# Patient Record
Sex: Male | Born: 1990 | Race: White | Hispanic: No | Marital: Single | Smoking: Former smoker
Health system: Southern US, Community
[De-identification: ages and names within clinical notes are randomized; demographics above are authoritative.]

## PROBLEM LIST (undated history)

## (undated) DIAGNOSIS — K219 Gastro-esophageal reflux disease without esophagitis: Secondary | ICD-10-CM

## (undated) DIAGNOSIS — J45909 Unspecified asthma, uncomplicated: Secondary | ICD-10-CM

## (undated) DIAGNOSIS — F259 Schizoaffective disorder, unspecified: Secondary | ICD-10-CM

## (undated) DIAGNOSIS — F4310 Post-traumatic stress disorder, unspecified: Secondary | ICD-10-CM

## (undated) DIAGNOSIS — K649 Unspecified hemorrhoids: Secondary | ICD-10-CM

## (undated) DIAGNOSIS — K589 Irritable bowel syndrome without diarrhea: Secondary | ICD-10-CM

## (undated) DIAGNOSIS — R569 Unspecified convulsions: Secondary | ICD-10-CM

## (undated) DIAGNOSIS — M1990 Unspecified osteoarthritis, unspecified site: Secondary | ICD-10-CM

## (undated) DIAGNOSIS — F419 Anxiety disorder, unspecified: Secondary | ICD-10-CM

## (undated) HISTORY — PX: OTHER SURGICAL HISTORY: SHX169

## (undated) HISTORY — PX: MOUTH SURGERY: SHX715

---
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
## 2012-05-14 ENCOUNTER — Encounter (HOSPITAL_COMMUNITY): Payer: Self-pay | Admitting: *Deleted

## 2012-05-14 ENCOUNTER — Emergency Department (HOSPITAL_COMMUNITY)
Admission: EM | Admit: 2012-05-14 | Discharge: 2012-05-19 | Disposition: A | Discharge status: Home / Self Care | Payer: MEDICAID | Attending: Emergency Medicine | Admitting: Emergency Medicine

## 2012-05-14 DIAGNOSIS — J45909 Unspecified asthma, uncomplicated: Secondary | ICD-10-CM | POA: Insufficient documentation

## 2012-05-14 DIAGNOSIS — K589 Irritable bowel syndrome without diarrhea: Secondary | ICD-10-CM | POA: Insufficient documentation

## 2012-05-14 DIAGNOSIS — F29 Unspecified psychosis not due to a substance or known physiological condition: Secondary | ICD-10-CM | POA: Insufficient documentation

## 2012-05-14 DIAGNOSIS — J701 Chronic and other pulmonary manifestations due to radiation: Secondary | ICD-10-CM | POA: Insufficient documentation

## 2012-05-14 DIAGNOSIS — F4310 Post-traumatic stress disorder, unspecified: Secondary | ICD-10-CM | POA: Insufficient documentation

## 2012-05-14 DIAGNOSIS — F411 Generalized anxiety disorder: Secondary | ICD-10-CM | POA: Insufficient documentation

## 2012-05-14 DIAGNOSIS — F259 Schizoaffective disorder, unspecified: Secondary | ICD-10-CM | POA: Insufficient documentation

## 2012-05-14 DIAGNOSIS — F410 Panic disorder [episodic paroxysmal anxiety] without agoraphobia: Secondary | ICD-10-CM | POA: Insufficient documentation

## 2012-05-14 HISTORY — DX: Post-traumatic stress disorder, unspecified: F43.10

## 2012-05-14 HISTORY — DX: Anxiety disorder, unspecified: F41.9

## 2012-05-14 HISTORY — DX: Gastro-esophageal reflux disease without esophagitis: K21.9

## 2012-05-14 HISTORY — DX: Unspecified asthma, uncomplicated: J45.909

## 2012-05-14 HISTORY — DX: Schizoaffective disorder, unspecified: F25.9

## 2012-05-14 HISTORY — DX: Unspecified osteoarthritis, unspecified site: M19.90

## 2012-05-14 HISTORY — DX: Irritable bowel syndrome, unspecified: K58.9

## 2012-05-14 LAB — CBC
MCV: 89.9 fL (ref 78.0–100.0)
Platelets: 305 10*3/uL (ref 150–400)
RBC: 4.93 MIL/uL (ref 4.22–5.81)
RDW: 12.5 % (ref 11.5–15.5)
WBC: 12.5 10*3/uL — ABNORMAL HIGH (ref 4.0–10.5)

## 2012-05-14 LAB — COMPREHENSIVE METABOLIC PANEL
ALT: 38 U/L (ref 0–53)
AST: 27 U/L (ref 0–37)
Albumin: 3.9 g/dL (ref 3.5–5.2)
Alkaline Phosphatase: 92 U/L (ref 39–117)
CO2: 25 mEq/L (ref 19–32)
Chloride: 100 mEq/L (ref 96–112)
Creatinine, Ser: 0.74 mg/dL (ref 0.50–1.35)
GFR calc non Af Amer: >90 mL/min (ref >90)
Potassium: 3.4 mEq/L — ABNORMAL LOW (ref 3.5–5.1)
Sodium: 137 mEq/L (ref 135–145)
Total Bilirubin: 0.4 mg/dL (ref 0.3–1.2)

## 2012-05-14 LAB — ETHANOL: Alcohol, Ethyl (B): <11 mg/dL (ref 0–11)

## 2012-05-14 MED ORDER — IBUPROFEN 200 MG PO TABS
600.0000 mg | ORAL_TABLET | Freq: Three times a day (TID) | ORAL | Status: DC | PRN
  Administered 2012-05-15: 600 mg via ORAL
  Filled 2012-05-14: qty 3

## 2012-05-14 MED ORDER — ONDANSETRON HCL 4 MG PO TABS: 4.0000 mg | ORAL_TABLET | Freq: Three times a day (TID) | ORAL | Status: DC | PRN

## 2012-05-14 MED ORDER — LORAZEPAM 1 MG PO TABS
1.0000 mg | ORAL_TABLET | Freq: Three times a day (TID) | ORAL | Status: DC | PRN
  Administered 2012-05-15 – 2012-05-17 (×4): 1 mg via ORAL
  Filled 2012-05-14 (×4): qty 1

## 2012-05-14 MED ORDER — ZOLPIDEM TARTRATE 5 MG PO TABS
5.0000 mg | ORAL_TABLET | Freq: Every evening | ORAL | Status: DC | PRN
  Administered 2012-05-15 – 2012-05-18 (×4): 5 mg via ORAL
  Filled 2012-05-14 (×4): qty 1

## 2012-05-14 MED ORDER — ALUM & MAG HYDROXIDE-SIMETH 200-200-20 MG/5ML PO SUSP: 30.0000 mL | ORAL | Status: DC | PRN

## 2012-05-14 NOTE — ED Provider Notes (Signed)
Medical screening examination/treatment/procedure(s) were performed by non-physician practitioner and as supervising physician I was immediately available for consultation/collaboration.   Charles B. Bernette Mayers, MD 05/14/12 [phone]

## 2012-05-14 NOTE — ED Provider Notes (Addendum)
History     CSN: [id number]  Arrival date & time 05/14/12  2157   First MD Initiated Contact with Patient 05/14/12 2239      Chief Complaint  Patient presents with  . Medical Clearance    (Consider location/radiation/quality/duration/timing/severity/associated sxs/prior treatment) HPI Comments: 21 year old male presents emergency Department with a panic attack. States he has had increased anxiety all day today. States he is living in town and he does not want to live in. His thought process is very tangential and I am unable to obtain a good history. He is a level V caveat. He states he is on many medications, but believes he is not on enough. He states no one cares about his care. He has a therapist with [facility]. Denies any suicidal or homicidal ideations. He keeps mentioning his sister who lives in [address], but when asked about her he states "no one ever listens to me". Denies any alcohol or drug use within the past week. Admits to marijuana use last week.  The history is provided by the patient. The history is limited by the condition of the patient.    Past Medical History  Diagnosis Date  . Schizoaffective disorder   . GERD (gastroesophageal reflux disease)   . Arthritis   . IBS (irritable bowel syndrome)   . Anxiety   . Asthma   . PTSD (post-traumatic stress disorder)     History reviewed. No pertinent past surgical history.  No family history on file.  History  Substance Use Topics  . Smoking status: Current Everyday Smoker -- 2.0 packs/day  . Smokeless tobacco: Not on file  . Alcohol Use: No      Review of Systems  Unable to perform ROS Constitutional:       When asking ROS, patient rolls his eyes and states "no one ever listens the first time"    Allergies  Divalproex sodium  Home Medications   Current Outpatient Rx  Name Route Sig Dispense Refill  . ALPRAZOLAM 0.25 MG PO TABS Oral Take 0.25 mg by mouth 2 (two) times daily.    Marland Kitchen  CARBAMAZEPINE 200 MG PO TABS Oral Take 200 mg by mouth 2 (two) times daily.      BP 142/83  Pulse 88  Temp 99 F (37.2 C) (Oral)  Resp 20  SpO2 96%  Physical Exam  Nursing note and vitals reviewed. Constitutional:       Unable to perform PE due to patient leaving during interview  Psychiatric: His mood appears anxious. His affect is angry. His speech is delayed and tangential. He is agitated. He expresses no homicidal and no suicidal ideation. He expresses no suicidal plans and no homicidal plans. He is inattentive.    ED Course  Procedures (including critical care time)   Labs Reviewed  CBC  COMPREHENSIVE METABOLIC PANEL  URINE RAPID DRUG SCREEN (HOSP PERFORMED)  ETHANOL   No results found.   1. Psychosis   2. Anxiety attack       MDM  21 year old male with anxiety attack. He denies any suicidal or homicidal ideations. He got angry during the interview and stated that "no one ever listens to what I said the first time" and then went on to say "I'd rather be in jail where some listens to me and I never should came to the emergency room". He then got up and ran out of the emergency department. Security was unable to get him. Patient does not appear to be  a harm to himself or others. I do not feel he needs involuntary commitment. I feel as if he is safe outside of the emergency department.     12:18 AM Patient returned to the emergency department and states he would like to stay. He was moved straight back to the psych ED.   Trevor Mace, PA-C 05/14/12 2327  Trevor Mace, PA-C 05/14/12 2333  Trevor Mace, PA-C 05/15/12 [phone]

## 2012-05-14 NOTE — ED Notes (Signed)
Pt tearful; crying; states has been feeling bad because he has been away from his family; states does not feel safe where he is living; states has therapy tomorrow

## 2012-05-15 LAB — URINALYSIS, ROUTINE W REFLEX MICROSCOPIC
Hgb urine dipstick: NEGATIVE
Nitrite: NEGATIVE
Protein, ur: NEGATIVE mg/dL
Specific Gravity, Urine: 1.03 (ref 1.005–1.030)
Urobilinogen, UA: 1 mg/dL (ref 0.0–1.0)

## 2012-05-15 LAB — RAPID URINE DRUG SCREEN, HOSP PERFORMED
Barbiturates: NOT DETECTED
Tetrahydrocannabinol: NOT DETECTED

## 2012-05-15 MED ORDER — LORAZEPAM 1 MG PO TABS
1.0000 mg | ORAL_TABLET | Freq: Two times a day (BID) | ORAL | Status: DC
  Administered 2012-05-16 – 2012-05-18 (×6): 1 mg via ORAL
  Filled 2012-05-15 (×7): qty 1

## 2012-05-15 MED ORDER — RISPERIDONE 1 MG PO TABS
1.0000 mg | ORAL_TABLET | Freq: Two times a day (BID) | ORAL | Status: DC
  Administered 2012-05-16 – 2012-05-18 (×6): 1 mg via ORAL
  Filled 2012-05-15 (×7): qty 1

## 2012-05-15 MED ORDER — CARBAMAZEPINE 200 MG PO TABS
200.0000 mg | ORAL_TABLET | Freq: Two times a day (BID) | ORAL | Status: DC
  Administered 2012-05-16 – 2012-05-19 (×7): 200 mg via ORAL
  Filled 2012-05-15 (×9): qty 1

## 2012-05-15 MED ORDER — ZIPRASIDONE MESYLATE 20 MG IM SOLR
20.0000 mg | Freq: Once | INTRAMUSCULAR | Status: AC
  Administered 2012-05-15: 20 mg via INTRAMUSCULAR
  Filled 2012-05-15: qty 20
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
## 2012-05-15 NOTE — ED Provider Notes (Signed)
Medical screening examination/treatment/procedure(s) were performed by non-physician practitioner and as supervising physician I was immediately available for consultation/collaboration.  Jasmine Awe, MD 05/15/12 [phone]
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
## 2012-05-15 NOTE — ED Notes (Signed)
Pt changed to blue scrubs; wanded by security 

## 2012-05-15 NOTE — ED Provider Notes (Signed)
Psychiatry recommends admission.  Glynn Octave, MD 05/15/12 [phone]
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
## 2012-05-15 NOTE — BH Assessment (Signed)
BHH Assessment Progress Note      Consulted with EDP Dr. Manus Gunning and recommended Tele-Psych consult on pt. Dr. Manus Gunning agreed to initiate tele-psych consult.

## 2012-05-15 NOTE — ED Notes (Signed)
Pt brought over from ED and taken to room 31 and made comfortable.

## 2012-05-15 NOTE — ED Notes (Signed)
Pt out at the desk made a phone call got through to his sister pt upset on the phone yelling out "I want to [facility] everyone" pt escorted back to his room by NT and this recorder pt then took his tray off the floor and gave it to this recorder pt was cursing and yelling at NT and RN the door to pt's room was closed pt observed picking up his tray and threw it at the door then went to the TV.pt punched tv broke screen. NT and Rn to the desk to call for assist and call EDP to receive new orders pt yelling at staff hitting the window at nurses desk and pulling at the window at nurses desk pt aggressive in stance towards other pts as well

## 2012-05-15 NOTE — ED Provider Notes (Signed)
Pt came last night for anxiety and possible psychosis. States he is feeling all right this morning. Is waiting for ACT evaluation.   Devoria Albe, MD, FACEP   Ward Givens, MD 05/15/12 0830

## 2012-05-15 NOTE — ED Provider Notes (Signed)
Nurses called about 11:49 and said patient wanted to be discharaged, has not had ACT evaluation yet. Advised he needed to wait.  Called back at 12:14 by nursing staff, patient having major emotional blow up, has torn up his paper scrubs and is screaming and threatening staff. Geodon ordered.   Devoria Albe, MD, Armando Gang   Ward Givens, MD 05/15/12 [phone]

## 2012-05-16 ENCOUNTER — Encounter (HOSPITAL_COMMUNITY): Payer: Self-pay | Admitting: *Deleted

## 2012-05-16 LAB — CARBAMAZEPINE LEVEL, TOTAL: Carbamazepine Lvl: 3.5 ug/mL — ABNORMAL LOW (ref 4.0–12.0)

## 2012-05-16 MED ORDER — LORAZEPAM 2 MG/ML IJ SOLN
2.0000 mg | INTRAMUSCULAR | Status: DC | PRN
  Administered 2012-05-16 – 2012-05-18 (×2): 2 mg via INTRAMUSCULAR
  Filled 2012-05-16 (×2): qty 1

## 2012-05-16 MED ORDER — NICOTINE 21 MG/24HR TD PT24
21.0000 mg | MEDICATED_PATCH | Freq: Every day | TRANSDERMAL | Status: DC
  Administered 2012-05-16 – 2012-05-18 (×3): 21 mg via TRANSDERMAL
  Filled 2012-05-16 (×4): qty 1

## 2012-05-16 MED ORDER — HALOPERIDOL LACTATE 5 MG/ML IJ SOLN
5.0000 mg | Freq: Four times a day (QID) | INTRAMUSCULAR | Status: DC | PRN
  Administered 2012-05-16 – 2012-05-18 (×4): 5 mg via INTRAMUSCULAR
  Filled 2012-05-16 (×4): qty 1
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
## 2012-05-16 NOTE — BHH Counselor (Signed)
Jane at [facility] - requesting Tegretol level. Also repeat CBC and chem tomorrow. Pt not on wait list yet until after labs are adequate.

## 2012-05-16 NOTE — BHH Counselor (Signed)
Pt has been referred to [facility] waiting for RN to call back when pt is officially on wait list. Authorization number is [id number].

## 2012-05-16 NOTE — ED Notes (Signed)
Info refaxed to [facility].

## 2012-05-16 NOTE — BHH Counselor (Signed)
Spoke with [facility] and they stated information on pt was not faxed over but a phone consult had been done. Previous counselor note states info was faxed and there was paperwork supporting previous counselor's note. Tammy Sours, SW, re-faxed [facility] form and pt's paperwork to [facility].

## 2012-05-16 NOTE — BH Assessment (Signed)
Assessment Note   John Gibson is an 21 y.o. male who initially presented to [facility] Emergency Department due to the chief complaint of increased anxiety. Per previous assessment, pt exhibited frustration in regards to his current living situation and was uncooperative with answering questions posed by assessor. During today's reassessment patient verbalized to writer that he desires to leave the emergency department, stating the he believes he is stable at this point. Per patient, yesterday he became agitated with staff and became destructive, evidenced by patient punching the television in his current room with it later having to be removed from the room. Patient verbalized to writer that when he becomes angry it is difficult for him manage his emotions. Patient reported that he currently does not have the desire to harm anyone or himself today although Clinical research associate observed patient to become verbally aggressive with staff when he was informed that he could not smoke cigarettes within the hospital. During reassessment patient was observed to exhibit irritability and anxiety in regard to his stay in the emergency department. Patient denies SI/HI/AVH at this time and is pending [facility] waitlist.   Axis I: Mood Disorder NOS Axis II: No diagnosis Axis III:  Past Medical History  Diagnosis Date  . Schizoaffective disorder   . GERD (gastroesophageal reflux disease)   . Arthritis   . IBS (irritable bowel syndrome)   . Anxiety   . Asthma   . PTSD (post-traumatic stress disorder)    Axis IV: housing problems, other psychosocial or environmental problems and problems related to social environment Axis V: 41-50 serious symptoms  Past Medical History:  Past Medical History  Diagnosis Date  . Schizoaffective disorder   . GERD (gastroesophageal reflux disease)   . Arthritis   . IBS (irritable bowel syndrome)   . Anxiety   . Asthma   . PTSD (post-traumatic stress disorder)     History reviewed. No  pertinent past surgical history.  Family History: No family history on file.  Social History:  reports that he has been smoking.  He does not have any smokeless tobacco history on file. He reports that he uses illicit drugs (Marijuana). He reports that he does not drink alcohol.  Additional Social History:  Alcohol / Drug Use Pain Medications: See MAR Prescriptions: See MAR Over the Counter: See MAR History of alcohol / drug use?: No history of alcohol / drug abuse (Not currently per pt)  CIWA: CIWA-Ar BP: 113/74 mmHg Pulse Rate: 79  COWS:    Allergies:  Allergies  Allergen Reactions  . Divalproex Sodium Hives    Home Medications:  (Not in a hospital admission)  OB/GYN Status:  No LMP for male patient.  General Assessment Data [facility] of Assessment: [facility] ACT Assessment: Yes Living Arrangements: Other (Comment) (Boarding house) Can pt return to current living arrangement?: Yes Admission Status: Involuntary Is patient capable of signing voluntary admission?: No Transfer from: Unknown Referral Source: MD     Risk to self Suicidal Ideation: No Suicidal Intent: No Is patient at risk for suicide?: No Suicidal Plan?: No Access to Means: No What has been your use of drugs/alcohol within the last 12 months?: THC noted Previous Attempts/Gestures: No Other Self Harm Risks: None Reported Triggers for Past Attempts: None known Intentional Self Injurious Behavior: None Family Suicide History: Unknown Recent stressful life event(s): Other (Comment) (Housing stressors, Limited support) Persecutory voices/beliefs?: No Depression: No Depression Symptoms: Feeling angry/irritable;Loss of interest in usual pleasures Substance abuse history and/or treatment for substance abuse?: Yes  Suicide prevention information given to non-admitted patients: Not applicable  Risk to Others Homicidal Ideation: No-Not Currently/Within Last 6 Months Thoughts of Harm to Others: No-Not Currently  Present/Within Last 6 Months Comment - Thoughts of Harm to Others: Was aggressive towards staff yesterday however no HI currently Current Homicidal Intent: No Current Homicidal Plan: No Access to Homicidal Means: No Identified Victim: None Reported History of harm to others?: No Assessment of Violence: None Noted Violent Behavior Description: Currently irritable and upset Does patient have access to weapons?: No Criminal Charges Pending?: No Does patient have a court date: No  Psychosis Hallucinations: None noted Delusions: None noted  Mental Status Report Appear/Hygiene: Disheveled Eye Contact: Fair Motor Activity: Restlessness Speech: Logical/coherent;Aggressive Level of Consciousness: Alert Mood: Depressed;Anxious Affect: Anxious;Apprehensive Anxiety Level: Moderate Thought Processes: Relevant;Coherent Judgement: Impaired Orientation: Person;Place;Time Obsessive Compulsive Thoughts/Behaviors: None  Cognitive Functioning Concentration: Decreased Memory: Recent Intact;Remote Intact IQ: Average Insight: Poor Impulse Control: Poor Appetite: Good Weight Loss: 0  Weight Gain: 0  Sleep: Decreased Total Hours of Sleep:  (Unknown) Vegetative Symptoms: None  ADLScreening [facility]) Patient's cognitive ability adequate to safely complete daily activities?: Yes Patient able to express need for assistance with ADLs?: Yes Independently performs ADLs?: Yes (appropriate for developmental age)  Abuse/Neglect [facility]) Physical Abuse:  (Pt would not say) Verbal Abuse:  (Pt would not say) Sexual Abuse:  (Pt would not say)  Prior Inpatient Therapy Prior Inpatient Therapy: Yes Prior Therapy Dates: current Prior Therapy Facilty[facility]Provider[facility] Reason for Treatment: Unknown  Prior Outpatient Therapy Prior Outpatient Therapy: No Prior Therapy Dates: ukn Prior Therapy Facilty/Provider(s): [facility] Reason for Treatment: ukn  ADL Screening (condition at  time of admission) Patient's cognitive ability adequate to safely complete daily activities?: Yes Patient able to express need for assistance with ADLs?: Yes Independently performs ADLs?: Yes (appropriate for developmental age) Weakness of Legs: None Weakness of Arms/Hands: None  Home Assistive Devices/Equipment Home Assistive Devices/Equipment: None  Therapy Consults (therapy consults require a physician order) PT Evaluation Needed: No OT Evalulation Needed: No SLP Evaluation Needed: No Abuse/Neglect Assessment (Assessment to be complete while patient is alone) Physical Abuse:  (Pt would not say) Verbal Abuse:  (Pt would not say) Sexual Abuse:  (Pt would not say) Exploitation of patient/patient's resources:  (Pt would not say) Self-Neglect: Denies Values / Beliefs Cultural Requests During Hospitalization: None Spiritual Requests During Hospitalization: None Consults Spiritual Care Consult Needed: No Social Work Consult Needed: No Merchant navy officer (For Healthcare) Advance Directive: Patient does not have advance directive;Patient would not like information Nutrition Screen- [facility] Adult/[facility] Have you recently lost weight without trying?: No Have you been eating poorly because of a decreased appetite?: No Malnutrition Screening Tool Score: 0   Additional Information 1:1 In Past 12 Months?: No CIRT Risk: Yes Elopement Risk: No Does patient have medical clearance?: Yes     Disposition: Pending [facility] wait list for inpatient treatment.  Disposition Disposition of Patient: Inpatient treatment program Type of inpatient treatment program: Adult Other disposition(s): Other (Comment) (Pending Telepsych)  On Site Evaluation by:   Reviewed with Physician:     Paulino Door, Lorah Kalina C 05/16/2012 2:03 PM

## 2012-05-16 NOTE — ED Provider Notes (Signed)
Patient resting comfortably. He broke the TV yesterday and was aggressive towards staff and was given Geodon. He is ordered for prn ativan now. As per psych, will need to be inpatient. Pending placement.   Richardean Canal, MD 05/16/12 [phone]

## 2012-05-17 LAB — CBC WITH DIFFERENTIAL/PLATELET
Basophils Absolute: 0.1 10*3/uL (ref 0.0–0.1)
Basophils Relative: 1 % (ref 0–1)
Eosinophils Relative: 5 % (ref 0–5)
HCT: 40.3 % (ref 39.0–52.0)
MCHC: 34.2 g/dL (ref 30.0–36.0)
MCV: 89.6 fL (ref 78.0–100.0)
Monocytes Absolute: 0.7 10*3/uL (ref 0.1–1.0)
Neutro Abs: 7.4 10*3/uL (ref 1.7–7.7)
Platelets: 243 10*3/uL (ref 150–400)
RDW: 12.4 % (ref 11.5–15.5)
WBC: 11.7 10*3/uL — ABNORMAL HIGH (ref 4.0–10.5)

## 2012-05-17 LAB — BASIC METABOLIC PANEL
Calcium: 9.2 mg/dL (ref 8.4–10.5)
Creatinine, Ser: 0.66 mg/dL (ref 0.50–1.35)
GFR calc Af Amer: >90 mL/min (ref >90)
Sodium: 137 mEq/L (ref 135–145)
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
## 2012-05-17 NOTE — ED Provider Notes (Addendum)
Patient sleeping comfortably. He is less agitated after starting on antipsychotics. Psych requested repeat CBC, BMP, will do that this AM. Pending inpatient psych placement.   Richardean Canal, MD 05/17/12 [phone]  CBC showed WBC 11, trending down. BMP unremarkable.   Richardean Canal, MD 05/17/12 [phone]

## 2012-05-17 NOTE — ED Notes (Signed)
DR. Dierdre Highman made aware of tegretol level 3.5. Pt currently taking tegretol. No new orders

## 2012-05-18 ENCOUNTER — Encounter (HOSPITAL_COMMUNITY): Payer: Self-pay

## 2012-05-18 DIAGNOSIS — F411 Generalized anxiety disorder: Secondary | ICD-10-CM

## 2012-05-18 DIAGNOSIS — F259 Schizoaffective disorder, unspecified: Secondary | ICD-10-CM

## 2012-05-18 DIAGNOSIS — F70 Mild intellectual disabilities: Secondary | ICD-10-CM

## 2012-05-18 LAB — CBC WITH DIFFERENTIAL/PLATELET
Basophils Absolute: 0.1 10*3/uL (ref 0.0–0.1)
Lymphocytes Relative: 11 % — ABNORMAL LOW (ref 12–46)
Lymphs Abs: 1.3 10*3/uL (ref 0.7–4.0)
Neutro Abs: 8.8 10*3/uL — ABNORMAL HIGH (ref 1.7–7.7)
Neutrophils Relative %: 80 % — ABNORMAL HIGH (ref 43–77)
Platelets: 262 10*3/uL (ref 150–400)
RBC: 4.83 MIL/uL (ref 4.22–5.81)
RDW: 12.3 % (ref 11.5–15.5)
WBC: 11 10*3/uL — ABNORMAL HIGH (ref 4.0–10.5)

## 2012-05-18 LAB — BASIC METABOLIC PANEL
CO2: 25 mEq/L (ref 19–32)
Calcium: 9.6 mg/dL (ref 8.4–10.5)
Chloride: 99 mEq/L (ref 96–112)
Potassium: 4.1 mEq/L (ref 3.5–5.1)
Sodium: 136 mEq/L (ref 135–145)

## 2012-05-18 LAB — CARBAMAZEPINE LEVEL, TOTAL: Carbamazepine Lvl: 8.4 ug/mL (ref 4.0–12.0)

## 2012-05-18 MED ORDER — LORAZEPAM 1 MG PO TABS: 1.0000 mg | ORAL_TABLET | Freq: Three times a day (TID) | ORAL | Status: DC | PRN

## 2012-05-18 MED ORDER — CARBAMAZEPINE 200 MG PO TABS: 200.0000 mg | ORAL_TABLET | Freq: Two times a day (BID) | ORAL | Status: DC

## 2012-05-18 MED ORDER — ALPRAZOLAM 0.25 MG PO TABS
0.2500 mg | ORAL_TABLET | Freq: Two times a day (BID) | ORAL | Status: DC
  Administered 2012-05-18 – 2012-05-19 (×2): 0.25 mg via ORAL
  Filled 2012-05-18 (×2): qty 1

## 2012-05-18 NOTE — BHH Counselor (Signed)
Writer has attempted to call patient's mother 2 more times and left voicemail's asking if she could pick patient up from the ED.

## 2012-05-18 NOTE — Progress Notes (Signed)
Per Jessie Foot with the ACT team, Pt ready for d/c, however his mother is unreachable.  CSW LM for Pt's mom.  CSW LM for Pt's payee, Adis Sturgill, at [phone].  Providence Crosby, LCSWA Clinical Social Work [phone]

## 2012-05-18 NOTE — ED Provider Notes (Signed)
Assuming care of [name] this morning.  Patient is awaiting psych evaluation - likely inpatient placement. Medically cleared. Patient had no complains, no concerns from the nursing side. Will continue to monitor.  John Kaplan, MD 05/18/12 [phone]

## 2012-05-18 NOTE — BHH Counselor (Signed)
Received call from patient's mother Arnoldo Lenis. She informed this Clinical research associate that  if patient is discharged his ACT team providers will pick him up. Patient's ACT team is called "[employer]". Writer contacted patient's ACT provider and spoke to staff. Per their staff they will not pick patient up. Writer attempted to call patient's mother back to see if she would be willing to pick him up and she did not pick up. Writer left a detailed v-mail asking him mother to call back.   Writer then contacted [name] to assist making sure patient has a safe discharge plan and appropriate housing. Per notes pt was residing in a boarding house and he has a payee. Writer shared this info with [place].

## 2012-05-18 NOTE — ED Provider Notes (Signed)
Pt does not need pysc admission,  But he has mr and does not know where he lives.  Social worker trying to contact mother and the person who takes care of his finances  Benny Lennert, MD 05/18/12 [phone]

## 2012-05-18 NOTE — Consult Note (Signed)
Reason for Consult: Anxiety disorder, in a new environment Referring Physician: Dr. Romie Jumper [name] is an 21 y.o. male.  HPI: This patient is reportedly suffering with the schizoaffective disorder, anxiety disorder, mild mental retardation, and has been staying in a boarding house in [facility] and has been receiving Assertive community support team from [employer]. Reportedly patient does not like living in [facility], took a PART public bus to come to [place] and wandering around down [employer] [address] [place] becomes anxious, and had anxiety attack, approached the police officer, requested help. Patient is on disability and has been receiving the disabled check and his payee is [facility] [place], [place], [place] 621 [phone] with  extension [phone]. Reportedly patient is considered himself homeless and trying to find a homeless shelter in [place]. Patient was found very anxious, and easily getting frustrated to participate in interview. Patient denies suicidal ideation, homicidal ideation, intentions, or plans. Patient has no reported the symptoms of for psychosis including auditory or visual hallucinations, delusions, or paranoia. Patient has poor to fair insight, judgment and impulse control.    Past Medical History  Diagnosis Date  . Schizoaffective disorder   . GERD (gastroesophageal reflux disease)   . Arthritis   . IBS (irritable bowel syndrome)   . Anxiety   . Asthma   . PTSD (post-traumatic stress disorder)     History reviewed. No pertinent past surgical history.  History reviewed. No pertinent family history.  Social History:  reports that he has been smoking.  He has never used smokeless tobacco. He reports that he uses illicit drugs (Marijuana). He reports that he does not drink alcohol.  Allergies:  Allergies  Allergen Reactions  . Divalproex Sodium Hives    Medications: I have reviewed the patient's current medications.  Results for orders  placed during the hospital encounter of 05/14/12 (from the past 48 hour(s))  CARBAMAZEPINE LEVEL, TOTAL     Status: Abnormal   Collection Time   05/16/12 10:09 PM      Component Value Range Comment   Carbamazepine Lvl 3.5 (*) 4.0 - 12.0 ug/mL   CBC WITH DIFFERENTIAL     Status: Abnormal   Collection Time   05/17/12  8:30 AM      Component Value Range Comment   WBC 11.7 (*) 4.0 - 10.5 K/uL    RBC 4.50  4.22 - 5.81 MIL/uL    Hemoglobin 13.8  13.0 - 17.0 g/dL    HCT 57.8  46.9 - 62.9 %    MCV 89.6  78.0 - 100.0 fL    MCH 30.7  26.0 - 34.0 pg    MCHC 34.2  30.0 - 36.0 g/dL    RDW 52.8  41.3 - 24.4 %    Platelets 243  150 - 400 K/uL    Neutrophils Relative 63  43 - 77 %    Neutro Abs 7.4  1.7 - 7.7 K/uL    Lymphocytes Relative 25  12 - 46 %    Lymphs Abs 2.9  0.7 - 4.0 K/uL    Monocytes Relative 6  3 - 12 %    Monocytes Absolute 0.7  0.1 - 1.0 K/uL    Eosinophils Relative 5  0 - 5 %    Eosinophils Absolute 0.6  0.0 - 0.7 K/uL    Basophils Relative 1  0 - 1 %    Basophils Absolute 0.1  0.0 - 0.1 K/uL   BASIC METABOLIC PANEL  Status: Normal   Collection Time   05/17/12  8:30 AM      Component Value Range Comment   Sodium 137  135 - 145 mEq/L    Potassium 4.0  3.5 - 5.1 mEq/L    Chloride 104  96 - 112 mEq/L    CO2 26  19 - 32 mEq/L    Glucose, Bld 87  70 - 99 mg/dL    BUN 10  6 - 23 mg/dL    Creatinine, Ser 1.61  0.50 - 1.35 mg/dL    Calcium 9.2  8.4 - 09.6 mg/dL    GFR calc non Af Amer >90  >90 mL/min    GFR calc Af Amer >90  >90 mL/min   CBC WITH DIFFERENTIAL     Status: Abnormal   Collection Time   05/18/12 11:12 AM      Component Value Range Comment   WBC 11.0 (*) 4.0 - 10.5 K/uL    RBC 4.83  4.22 - 5.81 MIL/uL    Hemoglobin 15.0  13.0 - 17.0 g/dL    HCT 04.5  40.9 - 81.1 %    MCV 88.8  78.0 - 100.0 fL    MCH 31.1  26.0 - 34.0 pg    MCHC 35.0  30.0 - 36.0 g/dL    RDW 91.4  78.2 - 95.6 %    Platelets 262  150 - 400 K/uL    Neutrophils Relative 80 (*) 43 - 77 %    Neutro  Abs 8.8 (*) 1.7 - 7.7 K/uL    Lymphocytes Relative 11 (*) 12 - 46 %    Lymphs Abs 1.3  0.7 - 4.0 K/uL    Monocytes Relative 6  3 - 12 %    Monocytes Absolute 0.7  0.1 - 1.0 K/uL    Eosinophils Relative 2  0 - 5 %    Eosinophils Absolute 0.2  0.0 - 0.7 K/uL    Basophils Relative 1  0 - 1 %    Basophils Absolute 0.1  0.0 - 0.1 K/uL   BASIC METABOLIC PANEL     Status: Normal   Collection Time   05/18/12 11:12 AM      Component Value Range Comment   Sodium 136  135 - 145 mEq/L    Potassium 4.1  3.5 - 5.1 mEq/L    Chloride 99  96 - 112 mEq/L    CO2 25  19 - 32 mEq/L    Glucose, Bld 77  70 - 99 mg/dL    BUN 13  6 - 23 mg/dL    Creatinine, Ser 2.13  0.50 - 1.35 mg/dL    Calcium 9.6  8.4 - 08.6 mg/dL    GFR calc non Af Amer >90  >90 mL/min    GFR calc Af Amer >90  >90 mL/min   CARBAMAZEPINE LEVEL, TOTAL     Status: Normal   Collection Time   05/18/12 11:12 AM      Component Value Range Comment   Carbamazepine Lvl 8.4  4.0 - 12.0 ug/mL     No results found.  Positive for anxiety, bad mood, learning difficulty, mood swings and mild mental retardation and feels people do not care for him.  Blood pressure 107/67, pulse 91, temperature 98.8 F (37.1 C), temperature source Oral, resp. rate 20, SpO2 96.00%.   Assessment/Plan: Schizoaffective disorder, most recent episode unspecified. Anxiety disorder, not otherwise specified. Mild mental retardation.  Patient does not meet criteria for acute psychiatric  hospitalization and will refer to the outpatient psychiatric services at treatment while living in a residential treatment center. Patient has act services. [employer] who can provide her transportation to the appropriate distention, place. Patient will continue his current medication, which are Tegretol 200 mg twice daily, Risperdal 1 mg twice daily, and Ativan 1 mg 2 times a day.  [name],JANARDHAHA R. 05/18/2012, 3:45 PM

## 2012-05-19 NOTE — ED Notes (Signed)
Pt escorted to bus stop by staff with ticket, cash and belongings.

## 2012-05-19 NOTE — ED Provider Notes (Signed)
Patient states he's doing fine. He's noted to have his mattress on the floor. Jessie Foot, ACT has discussed patient with Hamlin Bohanan, his financial guardian. She states the patient is his own legal guardian. She is from the [employer]. He has an appointment at 11 AM today to discuss his financial issues and they can offer him housing. She reports  however patient has refused any housing arrangement in the past. She states patient is very familiar with using the bus system and he will be discharged followup at 11 AM with her.  Devoria Albe, MD, FACEP   Ward Givens, MD 05/19/12 [phone]

## 2012-05-19 NOTE — ED Notes (Addendum)
Pt very aggitated. Anxious to leave. Waiting on SW with bus pass.

## 2012-05-19 NOTE — ED Notes (Signed)
AD,Tracie in. Called SW to get status of bus ticket. Problem solved.

## 2012-05-19 NOTE — ED Notes (Signed)
Pt becoming more and more agitated. Offered something for his nerves.

## 2012-05-19 NOTE — BHH Counselor (Signed)
Writer contacted [name] yesterday to request her assistance with a discharge plan for John Gibson. She was made aware that patient was psychiatrically cleared by Dr. Elsie Saas and in need of SW assistance. Explained to SW that patient is MR, has a payee, and has possible housing in [facility]. SW told that patient was unable to explain to this Clinical research associate where he was staying or provide an address. ED notes state that patient was living in a boarding house. Writer concerned about patient's capacity to make decisions once he is discharge and shared those thoughts with [name]. She patients payee would be called for additional information. Writer also shared with [name] that patient's mother called stating that staff from patients ACT team "[employer]" would pick patient up once he was ready for discharged. Writer contacted patient's ACT team and their staff stated, "No we are unable to pick patient up and we do not provide transport". Writer shared this information with [name] and she stated that she would call patients mother as well. Per her notes she was only able to leave messages with both patients mother and payee.   This am writer attempted to contacted SW starting 8am. Writer was not able to get in contact with anyone until 8:40am after numerous attempts not only by this Clinical research associate but also by nursing staff.   Due to the inability to contact SW writer went ahead and contacted patient's mother but she did not answer. Patient's payee-John Gibson at the [facility] was then contacted and she stated that patient had an 11am appointment today. [name] stated that during that appointment she would offer patient money for housing, housing placement, and support for housing as needed.  Writer attempted to contact SW again and finally received a call back from [name]. Writer asked for a city bus pass and PART pass so that patient may attempt to make it to his appointment. SW came down and brought this pass  approx. 2 hours after requested by this Clinical research associate. Patient discharged from the ED to hopefully make it to [facility] and see his payee even though he will probably miss his 11am appointment at the [facility].

## 2012-11-08 ENCOUNTER — Encounter (HOSPITAL_COMMUNITY): Payer: Self-pay | Admitting: Emergency Medicine

## 2012-11-08 ENCOUNTER — Emergency Department (HOSPITAL_COMMUNITY)
Admission: EM | Admit: 2012-11-08 | Discharge: 2012-11-08 | Disposition: A | Discharge status: Home / Self Care | Payer: Medicaid Other | Attending: Emergency Medicine | Admitting: Emergency Medicine

## 2012-11-08 DIAGNOSIS — R112 Nausea with vomiting, unspecified: Secondary | ICD-10-CM

## 2012-11-08 DIAGNOSIS — F17200 Nicotine dependence, unspecified, uncomplicated: Secondary | ICD-10-CM | POA: Insufficient documentation

## 2012-11-08 DIAGNOSIS — F419 Anxiety disorder, unspecified: Secondary | ICD-10-CM

## 2012-11-08 DIAGNOSIS — R197 Diarrhea, unspecified: Secondary | ICD-10-CM | POA: Insufficient documentation

## 2012-11-08 DIAGNOSIS — M129 Arthropathy, unspecified: Secondary | ICD-10-CM | POA: Insufficient documentation

## 2012-11-08 DIAGNOSIS — F259 Schizoaffective disorder, unspecified: Secondary | ICD-10-CM | POA: Insufficient documentation

## 2012-11-08 DIAGNOSIS — F411 Generalized anxiety disorder: Secondary | ICD-10-CM | POA: Insufficient documentation

## 2012-11-08 DIAGNOSIS — J45909 Unspecified asthma, uncomplicated: Secondary | ICD-10-CM | POA: Insufficient documentation

## 2012-11-08 DIAGNOSIS — K589 Irritable bowel syndrome without diarrhea: Secondary | ICD-10-CM | POA: Insufficient documentation

## 2012-11-08 DIAGNOSIS — Z79899 Other long term (current) drug therapy: Secondary | ICD-10-CM | POA: Insufficient documentation

## 2012-11-08 DIAGNOSIS — E876 Hypokalemia: Secondary | ICD-10-CM

## 2012-11-08 DIAGNOSIS — F4310 Post-traumatic stress disorder, unspecified: Secondary | ICD-10-CM | POA: Insufficient documentation

## 2012-11-08 DIAGNOSIS — K219 Gastro-esophageal reflux disease without esophagitis: Secondary | ICD-10-CM | POA: Insufficient documentation

## 2012-11-08 LAB — CBC
HCT: 41.8 % (ref 39.0–52.0)
Hemoglobin: 15 g/dL (ref 13.0–17.0)
MCH: 31.9 pg (ref 26.0–34.0)
MCHC: 35.9 g/dL (ref 30.0–36.0)
RDW: 12.2 % (ref 11.5–15.5)

## 2012-11-08 LAB — BASIC METABOLIC PANEL
CO2: 21 mEq/L (ref 19–32)
Calcium: 8.5 mg/dL (ref 8.4–10.5)
Creatinine, Ser: 0.77 mg/dL (ref 0.50–1.35)
GFR calc non Af Amer: >90 mL/min (ref >90)
Glucose, Bld: 114 mg/dL — ABNORMAL HIGH (ref 70–99)

## 2012-11-08 MED ORDER — POTASSIUM CHLORIDE CRYS ER 20 MEQ PO TBCR
20.0000 meq | EXTENDED_RELEASE_TABLET | Freq: Once | ORAL | Status: AC
  Administered 2012-11-08: 20 meq via ORAL
  Filled 2012-11-08: qty 1

## 2012-11-08 MED ORDER — ALPRAZOLAM 0.5 MG PO TABS
0.5000 mg | ORAL_TABLET | Freq: Once | ORAL | Status: AC
  Administered 2012-11-08: 0.5 mg via ORAL
  Filled 2012-11-08: qty 1

## 2012-11-08 MED ORDER — ONDANSETRON 8 MG PO TBDP
8.0000 mg | ORAL_TABLET | Freq: Once | ORAL | Status: AC
  Administered 2012-11-08: 8 mg via ORAL
  Filled 2012-11-08: qty 1

## 2012-11-08 NOTE — ED Provider Notes (Signed)
History     CSN: [id number]  Arrival date & time 11/08/12  1912   First MD Initiated Contact with Patient 11/08/12 1932      Chief Complaint  Patient presents with  . Flu-like Symptoms     (Consider location/radiation/quality/duration/timing/severity/associated sxs/prior treatment) The history is provided by the patient.  pt with hx schizoaffective disorder, anxiety, IBS, c/o nvd onset this morning. 1-2 episodes of emesis throughout course of day, not bloody or bilious. Has had 5-6 diarrhea stools, loose to watery, not bloody. Denies known ill contacts, recent travel, recent abx use, or known bad food ingestion. No abd pain. No faintness or dizziness. Denies cough, sore throat, or other uri c/o. Low grade fever. No chills/sweats. No gu c/o. No rash. Denies any change in meds or new meds.      Past Medical History  Diagnosis Date  . Schizoaffective disorder   . GERD (gastroesophageal reflux disease)   . Arthritis   . IBS (irritable bowel syndrome)   . Anxiety   . Asthma   . PTSD (post-traumatic stress disorder)     History reviewed. No pertinent past surgical history.  No family history on file.  History  Substance Use Topics  . Smoking status: Current Every Day Smoker -- 2.00 packs/day  . Smokeless tobacco: Never Used  . Alcohol Use: No     Comment: Pt denies ETOH usage      Review of Systems  Constitutional: Negative for chills.  HENT: Negative for sore throat, neck pain and neck stiffness.   Eyes: Negative for redness and visual disturbance.  Respiratory: Negative for cough and shortness of breath.   Cardiovascular: Negative for chest pain.  Gastrointestinal: Positive for vomiting and diarrhea. Negative for abdominal pain.  Endocrine: Negative for polydipsia and polyuria.  Genitourinary: Negative for dysuria and flank pain.  Musculoskeletal: Negative for back pain.  Skin: Negative for rash.  Neurological: Negative for headaches.  Hematological: Does not  bruise/bleed easily.  Psychiatric/Behavioral: The patient is nervous/anxious.     Allergies  Divalproex sodium  Home Medications   Current Outpatient Rx  Name  Route  Sig  Dispense  Refill  . albuterol (PROVENTIL HFA;VENTOLIN HFA) 108 (90 BASE) MCG/ACT inhaler   Inhalation   Inhale 2 puffs into the lungs every 6 (six) hours as needed (wheezing).         . ALPRAZolam (XANAX) 0.25 MG tablet   Oral   Take 0.25 mg by mouth 2 (two) times daily.         . carbamazepine (TEGRETOL) 200 MG tablet   Oral   Take 200 mg by mouth 2 (two) times daily.         Marland Kitchen dicyclomine (BENTYL) 10 MG capsule   Oral   Take 10 mg by mouth 4 (four) times daily.         Marland Kitchen esomeprazole (NEXIUM) 40 MG capsule   Oral   Take 40 mg by mouth daily before breakfast.         . lithium carbonate 300 MG capsule   Oral   Take 300 mg by mouth 2 (two) times daily with a meal.         . naproxen sodium (ANAPROX) 220 MG tablet   Oral   Take 220 mg by mouth daily as needed.         . risperiDONE (RISPERDAL) 1 MG tablet   Oral   Take 1 mg by mouth 2 (two) times daily.  BP 113/73  Pulse 125  Temp(Src) 100.3 F (37.9 C) (Oral)  Resp 20  SpO2 96%  Physical Exam  Nursing note and vitals reviewed. Constitutional: He is oriented to person, place, and time. He appears well-developed and well-nourished. No distress.  HENT:  Head: Atraumatic.  Nose: Nose normal.  Mouth/Throat: Oropharynx is clear and moist.  Eyes: Conjunctivae are normal. No scleral icterus.  Neck: Neck supple. No tracheal deviation present. No thyromegaly present.  No stiffness or rigidity  Cardiovascular: Regular rhythm, normal heart sounds and intact distal pulses.  Exam reveals no gallop and no friction rub.   No murmur heard. Pulmonary/Chest: Effort normal and breath sounds normal. No accessory muscle usage. No respiratory distress.  Abdominal: Soft. Bowel sounds are normal. He exhibits no distension and no  mass. There is no tenderness. There is no rebound and no guarding.  Genitourinary:  No cva tenderness  Musculoskeletal: Normal range of motion. He exhibits no edema and no tenderness.  Neurological: He is alert and oriented to person, place, and time.  Skin: Skin is warm and dry. No rash noted. He is not diaphoretic.  Psychiatric:  anxious    ED Course  Procedures (including critical care time)   Results for orders placed during the hospital encounter of 11/08/12  BASIC METABOLIC PANEL      Result Value Range   Sodium 131 (*) 135 - 145 mEq/L   Potassium 2.9 (*) 3.5 - 5.1 mEq/L   Chloride 97  96 - 112 mEq/L   CO2 21  19 - 32 mEq/L   Glucose, Bld 114 (*) 70 - 99 mg/dL   BUN 7  6 - 23 mg/dL   Creatinine, Ser 1.61  0.50 - 1.35 mg/dL   Calcium 8.5  8.4 - 09.6 mg/dL   GFR calc non Af Amer >90  >90 mL/min   GFR calc Af Amer >90  >90 mL/min  LITHIUM LEVEL      Result Value Range   Lithium Lvl <0.25 (*) 0.80 - 1.40 mEq/L  CARBAMAZEPINE LEVEL, TOTAL      Result Value Range   Carbamazepine Lvl 9.8  4.0 - 12.0 ug/mL  CBC      Result Value Range   WBC 12.7 (*) 4.0 - 10.5 K/uL   RBC 4.70  4.22 - 5.81 MIL/uL   Hemoglobin 15.0  13.0 - 17.0 g/dL   HCT 04.5  40.9 - 81.1 %   MCV 88.9  78.0 - 100.0 fL   MCH 31.9  26.0 - 34.0 pg   MCHC 35.9  30.0 - 36.0 g/dL   RDW 91.4  78.2 - 95.6 %   Platelets 280  150 - 400 K/uL      MDM  zofran po. Po fluids.  Pt requests xanax for his 'nerves'.  Xanax po.  Reviewed nursing notes and prior charts for additional history.   kcl po.   Tolerating po fluids.   No recurrent nvd in ed.  Pt resting comfortably. No distress, appears stable for d/c.         Suzi Roots, MD 11/08/12 2209

## 2012-11-08 NOTE — ED Notes (Signed)
Per EMS n/v/d headache since this morning.

## 2012-11-08 NOTE — ED Notes (Signed)
Pt c/o N/V/D that started today. Pt unable to keep food down.

## 2012-11-10 ENCOUNTER — Encounter (HOSPITAL_COMMUNITY): Payer: Self-pay | Admitting: Emergency Medicine

## 2012-11-10 ENCOUNTER — Emergency Department (HOSPITAL_COMMUNITY)
Admission: EM | Admit: 2012-11-10 | Discharge: 2012-11-11 | Disposition: A | Discharge status: Home / Self Care | Payer: Medicaid Other | Attending: Emergency Medicine | Admitting: Emergency Medicine

## 2012-11-10 DIAGNOSIS — K5289 Other specified noninfective gastroenteritis and colitis: Secondary | ICD-10-CM | POA: Insufficient documentation

## 2012-11-10 DIAGNOSIS — F4310 Post-traumatic stress disorder, unspecified: Secondary | ICD-10-CM | POA: Insufficient documentation

## 2012-11-10 DIAGNOSIS — F17200 Nicotine dependence, unspecified, uncomplicated: Secondary | ICD-10-CM | POA: Insufficient documentation

## 2012-11-10 DIAGNOSIS — F411 Generalized anxiety disorder: Secondary | ICD-10-CM | POA: Insufficient documentation

## 2012-11-10 DIAGNOSIS — F259 Schizoaffective disorder, unspecified: Secondary | ICD-10-CM | POA: Insufficient documentation

## 2012-11-10 DIAGNOSIS — J45909 Unspecified asthma, uncomplicated: Secondary | ICD-10-CM | POA: Insufficient documentation

## 2012-11-10 DIAGNOSIS — K219 Gastro-esophageal reflux disease without esophagitis: Secondary | ICD-10-CM | POA: Insufficient documentation

## 2012-11-10 DIAGNOSIS — K589 Irritable bowel syndrome without diarrhea: Secondary | ICD-10-CM | POA: Insufficient documentation

## 2012-11-10 DIAGNOSIS — Z8739 Personal history of other diseases of the musculoskeletal system and connective tissue: Secondary | ICD-10-CM | POA: Insufficient documentation

## 2012-11-10 DIAGNOSIS — Z79899 Other long term (current) drug therapy: Secondary | ICD-10-CM | POA: Insufficient documentation

## 2012-11-10 LAB — CBC WITH DIFFERENTIAL/PLATELET
Basophils Absolute: 0.1 10*3/uL (ref 0.0–0.1)
Basophils Relative: 1 % (ref 0–1)
Eosinophils Absolute: 0.5 10*3/uL (ref 0.0–0.7)
Eosinophils Relative: 5 % (ref 0–5)
MCH: 32.3 pg (ref 26.0–34.0)
MCHC: 36.9 g/dL — ABNORMAL HIGH (ref 30.0–36.0)
MCV: 87.5 fL (ref 78.0–100.0)
Platelets: 340 10*3/uL (ref 150–400)
RDW: 12.5 % (ref 11.5–15.5)

## 2012-11-10 LAB — COMPREHENSIVE METABOLIC PANEL
ALT: 37 U/L (ref 0–53)
AST: 28 U/L (ref 0–37)
Albumin: 3.9 g/dL (ref 3.5–5.2)
Calcium: 9 mg/dL (ref 8.4–10.5)
GFR calc Af Amer: >90 mL/min (ref >90)
Glucose, Bld: 102 mg/dL — ABNORMAL HIGH (ref 70–99)
Sodium: 141 mEq/L (ref 135–145)
Total Protein: 7.7 g/dL (ref 6.0–8.3)

## 2012-11-10 NOTE — ED Notes (Signed)
Pt to ED via [facility] with c/o abd pain.  Pt was seen at [facility] on Sat and was told his K+ was low.

## 2012-11-10 NOTE — ED Provider Notes (Signed)
History     CSN: [id number]  Arrival date & time 11/10/12  2008   First MD Initiated Contact with Patient 11/10/12 2346      Chief Complaint  Patient presents with  . Abdominal Pain    (Consider location/radiation/quality/duration/timing/severity/associated sxs/prior treatment) HPI Comments: The patient presents here with epigastric discomfort, nausea, vomiting, and diarrhea.  This has been going on for the past two days.  He was seen at [facility] two days ago for similar complaints but doesn't feel as though he is getting better.  No fevers or chills.    Patient is a 22 y.o. male presenting with abdominal pain. The history is provided by the patient.  Abdominal Pain Pain location:  Epigastric Pain quality: cramping   Pain severity:  Moderate Onset quality:  Gradual Duration:  2 days Timing:  Intermittent Chronicity:  New Context: not diet changes and not recent illness   Relieved by:  Nothing Worsened by:  Nothing tried Ineffective treatments: zofran. Associated symptoms: no dysuria and no fatigue     Past Medical History  Diagnosis Date  . Schizoaffective disorder   . GERD (gastroesophageal reflux disease)   . Arthritis   . IBS (irritable bowel syndrome)   . Anxiety   . Asthma   . PTSD (post-traumatic stress disorder)     History reviewed. No pertinent past surgical history.  No family history on file.  History  Substance Use Topics  . Smoking status: Current Every Day Smoker -- 2.00 packs/day  . Smokeless tobacco: Never Used  . Alcohol Use: No     Comment: Pt denies ETOH usage      Review of Systems  Constitutional: Negative for fatigue.  Gastrointestinal: Positive for abdominal pain.  Genitourinary: Negative for dysuria.  All other systems reviewed and are negative.    Allergies  Divalproex sodium  Home Medications   Current Outpatient Rx  Name  Route  Sig  Dispense  Refill  . albuterol (PROVENTIL HFA;VENTOLIN HFA) 108 (90 BASE) MCG/ACT inhaler  Inhalation   Inhale 2 puffs into the lungs every 6 (six) hours as needed (wheezing).         . ALPRAZolam (XANAX) 0.25 MG tablet   Oral   Take 0.25 mg by mouth 2 (two) times daily.         . carbamazepine (TEGRETOL) 200 MG tablet   Oral   Take 200 mg by mouth 2 (two) times daily.         Marland Kitchen dicyclomine (BENTYL) 10 MG capsule   Oral   Take 10 mg by mouth 4 (four) times daily.         Marland Kitchen esomeprazole (NEXIUM) 40 MG capsule   Oral   Take 40 mg by mouth daily before breakfast.         . lithium carbonate 300 MG capsule   Oral   Take 300 mg by mouth 2 (two) times daily with a meal.         . naproxen sodium (ANAPROX) 220 MG tablet   Oral   Take 220 mg by mouth daily as needed.         . risperiDONE (RISPERDAL) 1 MG tablet   Oral   Take 1 mg by mouth 2 (two) times daily.           BP 138/69  Pulse 78  Temp(Src) 99.2 F (37.3 C) (Oral)  Resp 14  SpO2 99%  Physical Exam  Nursing note and vitals reviewed. Constitutional: He is  oriented to person, place, and time. He appears well-developed and well-nourished. No distress.  HENT:  Head: Normocephalic and atraumatic.  Mouth/Throat: Oropharynx is clear and moist.  Neck: Normal range of motion. Neck supple.  Cardiovascular: Normal rate and regular rhythm.   No murmur heard. Pulmonary/Chest: Effort normal and breath sounds normal. No respiratory distress. He has no wheezes.  Abdominal: Soft. Bowel sounds are normal. He exhibits no distension. There is no tenderness.  Musculoskeletal: Normal range of motion. He exhibits no edema.  Lymphadenopathy:    He has no cervical adenopathy.  Neurological: He is alert and oriented to person, place, and time.  Skin: Skin is warm and dry. He is not diaphoretic.    ED Course  Procedures (including critical care time)  Labs Reviewed  CBC WITH DIFFERENTIAL - Abnormal; Notable for the following:    WBC 10.9 (*)    MCHC 36.9 (*)    All other components within normal  limits  COMPREHENSIVE METABOLIC PANEL - Abnormal; Notable for the following:    Potassium 3.2 (*)    Glucose, Bld 102 (*)    Total Bilirubin 0.2 (*)    All other components within normal limits   No results found.   No diagnosis found.    MDM  The wbc is improving and the abdominal exam is benign.  I suspect this is gastroenteritis that I feel needs only time to run its course.  He does not appear clinically dehydrated and the labs do not reflect this either.  Will discharge to home with ultram for his pain.        Geoffery Lyons, MD 11/11/12 [phone]

## 2012-11-10 NOTE — ED Notes (Signed)
Pt st's he has had abd pain since Sun.  St's was given 1 Potassium pill at [facility] but no Rx.  Pt st's he has continued to have abd cramping.  No vomiting but has not appetite

## 2012-11-11 MED ORDER — TRAMADOL HCL 50 MG PO TABS: 50.0000 mg | ORAL_TABLET | Freq: Four times a day (QID) | ORAL | Status: DC | PRN

## 2012-11-27 ENCOUNTER — Encounter (HOSPITAL_COMMUNITY): Payer: Self-pay | Admitting: Emergency Medicine

## 2012-11-27 ENCOUNTER — Emergency Department (HOSPITAL_COMMUNITY)
Admission: EM | Admit: 2012-11-27 | Discharge: 2012-11-28 | Disposition: A | Discharge status: Home / Self Care | Payer: 59 | Attending: Emergency Medicine | Admitting: Emergency Medicine

## 2012-11-27 DIAGNOSIS — K5900 Constipation, unspecified: Secondary | ICD-10-CM | POA: Insufficient documentation

## 2012-11-27 DIAGNOSIS — M549 Dorsalgia, unspecified: Secondary | ICD-10-CM | POA: Insufficient documentation

## 2012-11-27 DIAGNOSIS — K219 Gastro-esophageal reflux disease without esophagitis: Secondary | ICD-10-CM | POA: Insufficient documentation

## 2012-11-27 DIAGNOSIS — Z8659 Personal history of other mental and behavioral disorders: Secondary | ICD-10-CM | POA: Insufficient documentation

## 2012-11-27 DIAGNOSIS — F911 Conduct disorder, childhood-onset type: Secondary | ICD-10-CM | POA: Insufficient documentation

## 2012-11-27 DIAGNOSIS — R1013 Epigastric pain: Secondary | ICD-10-CM | POA: Insufficient documentation

## 2012-11-27 DIAGNOSIS — F329 Major depressive disorder, single episode, unspecified: Secondary | ICD-10-CM | POA: Insufficient documentation

## 2012-11-27 DIAGNOSIS — Z79899 Other long term (current) drug therapy: Secondary | ICD-10-CM | POA: Insufficient documentation

## 2012-11-27 DIAGNOSIS — F3289 Other specified depressive episodes: Secondary | ICD-10-CM | POA: Insufficient documentation

## 2012-11-27 DIAGNOSIS — Z8739 Personal history of other diseases of the musculoskeletal system and connective tissue: Secondary | ICD-10-CM | POA: Insufficient documentation

## 2012-11-27 DIAGNOSIS — F17200 Nicotine dependence, unspecified, uncomplicated: Secondary | ICD-10-CM | POA: Insufficient documentation

## 2012-11-27 DIAGNOSIS — R112 Nausea with vomiting, unspecified: Secondary | ICD-10-CM | POA: Insufficient documentation

## 2012-11-27 DIAGNOSIS — F259 Schizoaffective disorder, unspecified: Secondary | ICD-10-CM | POA: Insufficient documentation

## 2012-11-27 DIAGNOSIS — F411 Generalized anxiety disorder: Secondary | ICD-10-CM | POA: Insufficient documentation

## 2012-11-27 DIAGNOSIS — Z8719 Personal history of other diseases of the digestive system: Secondary | ICD-10-CM | POA: Insufficient documentation

## 2012-11-27 DIAGNOSIS — J45909 Unspecified asthma, uncomplicated: Secondary | ICD-10-CM | POA: Insufficient documentation

## 2012-11-27 DIAGNOSIS — F419 Anxiety disorder, unspecified: Secondary | ICD-10-CM
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
## 2012-11-27 NOTE — ED Notes (Signed)
Rambling speech noted with disorganized thoughts. Pt resistive to giving up play station for staff to lock up. Security at bedside.

## 2012-11-28 LAB — CBC WITH DIFFERENTIAL/PLATELET
Basophils Relative: 1 % (ref 0–1)
HCT: 44.1 % (ref 39.0–52.0)
Hemoglobin: 14.9 g/dL (ref 13.0–17.0)
MCHC: 33.8 g/dL (ref 30.0–36.0)
Monocytes Absolute: 0.8 10*3/uL (ref 0.1–1.0)
Monocytes Relative: 6 % (ref 3–12)
Neutro Abs: 8 10*3/uL — ABNORMAL HIGH (ref 1.7–7.7)

## 2012-11-28 LAB — COMPREHENSIVE METABOLIC PANEL
Albumin: 4 g/dL (ref 3.5–5.2)
BUN: 9 mg/dL (ref 6–23)
CO2: 25 mEq/L (ref 19–32)
Chloride: 101 mEq/L (ref 96–112)
Creatinine, Ser: 0.76 mg/dL (ref 0.50–1.35)
GFR calc Af Amer: >90 mL/min (ref >90)
GFR calc non Af Amer: >90 mL/min (ref >90)
Total Bilirubin: 0.2 mg/dL — ABNORMAL LOW (ref 0.3–1.2)

## 2012-11-28 LAB — RAPID URINE DRUG SCREEN, HOSP PERFORMED
Cocaine: NOT DETECTED
Opiates: NOT DETECTED

## 2012-11-28 MED ORDER — DIAZEPAM 5 MG PO TABS
5.0000 mg | ORAL_TABLET | ORAL | Status: AC
  Administered 2012-11-28: 5 mg via ORAL
  Filled 2012-11-28: qty 1

## 2012-11-28 NOTE — ED Provider Notes (Signed)
Medical screening examination/treatment/procedure(s) were performed by non-physician practitioner and as supervising physician I was immediately available for consultation/collaboration.   Ellamay Fors B. Bernette Mayers, MD 11/28/12 [phone]
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
## 2012-12-16 ENCOUNTER — Emergency Department (HOSPITAL_COMMUNITY)
Admission: EM | Admit: 2012-12-16 | Discharge: 2012-12-17 | Disposition: A | Discharge status: Other Acute Inpatient Hospital | Payer: Medicaid Other | Attending: Emergency Medicine | Admitting: Emergency Medicine

## 2012-12-16 ENCOUNTER — Encounter (HOSPITAL_COMMUNITY): Payer: Self-pay | Admitting: Emergency Medicine

## 2012-12-16 DIAGNOSIS — F17200 Nicotine dependence, unspecified, uncomplicated: Secondary | ICD-10-CM | POA: Insufficient documentation

## 2012-12-16 DIAGNOSIS — F259 Schizoaffective disorder, unspecified: Secondary | ICD-10-CM | POA: Insufficient documentation

## 2012-12-16 DIAGNOSIS — J45909 Unspecified asthma, uncomplicated: Secondary | ICD-10-CM | POA: Insufficient documentation

## 2012-12-16 DIAGNOSIS — F4310 Post-traumatic stress disorder, unspecified: Secondary | ICD-10-CM | POA: Insufficient documentation

## 2012-12-16 DIAGNOSIS — Z8719 Personal history of other diseases of the digestive system: Secondary | ICD-10-CM | POA: Insufficient documentation

## 2012-12-16 DIAGNOSIS — R45850 Homicidal ideations: Secondary | ICD-10-CM | POA: Insufficient documentation

## 2012-12-16 DIAGNOSIS — F411 Generalized anxiety disorder: Secondary | ICD-10-CM | POA: Insufficient documentation

## 2012-12-16 DIAGNOSIS — Z79899 Other long term (current) drug therapy: Secondary | ICD-10-CM | POA: Insufficient documentation

## 2012-12-16 DIAGNOSIS — K219 Gastro-esophageal reflux disease without esophagitis: Secondary | ICD-10-CM | POA: Insufficient documentation

## 2012-12-16 DIAGNOSIS — M129 Arthropathy, unspecified: Secondary | ICD-10-CM | POA: Insufficient documentation

## 2012-12-16 LAB — CBC
HCT: 43.6 % (ref 39.0–52.0)
MCV: 89.9 fL (ref 78.0–100.0)
RBC: 4.85 MIL/uL (ref 4.22–5.81)
WBC: 13 10*3/uL — ABNORMAL HIGH (ref 4.0–10.5)

## 2012-12-16 LAB — COMPREHENSIVE METABOLIC PANEL
AST: 26 U/L (ref 0–37)
Alkaline Phosphatase: 78 U/L (ref 39–117)
BUN: 9 mg/dL (ref 6–23)
CO2: 26 mEq/L (ref 19–32)
Chloride: 105 mEq/L (ref 96–112)
Creatinine, Ser: 0.73 mg/dL (ref 0.50–1.35)
GFR calc non Af Amer: >90 mL/min (ref >90)
Total Bilirubin: 0.3 mg/dL (ref 0.3–1.2)

## 2012-12-16 LAB — SALICYLATE LEVEL: Salicylate Lvl: <2 mg/dL — ABNORMAL LOW (ref 2.8–20.0)

## 2012-12-16 LAB — RAPID URINE DRUG SCREEN, HOSP PERFORMED
Amphetamines: NOT DETECTED
Barbiturates: NOT DETECTED
Benzodiazepines: NOT DETECTED
Cocaine: NOT DETECTED

## 2012-12-16 LAB — ETHANOL: Alcohol, Ethyl (B): <11 mg/dL (ref 0–11)

## 2012-12-16 MED ORDER — ALBUTEROL SULFATE HFA 108 (90 BASE) MCG/ACT IN AERS
2.0000 | INHALATION_SPRAY | Freq: Once | RESPIRATORY_TRACT | Status: AC
  Administered 2012-12-17: 2 via RESPIRATORY_TRACT
  Filled 2012-12-16: qty 6.7

## 2012-12-16 MED ORDER — ZOLPIDEM TARTRATE 5 MG PO TABS: 5.0000 mg | ORAL_TABLET | Freq: Every evening | ORAL | Status: DC | PRN

## 2012-12-16 MED ORDER — ALUM & MAG HYDROXIDE-SIMETH 200-200-20 MG/5ML PO SUSP: 30.0000 mL | ORAL | Status: DC | PRN

## 2012-12-16 MED ORDER — LORAZEPAM 1 MG PO TABS
1.0000 mg | ORAL_TABLET | Freq: Three times a day (TID) | ORAL | Status: DC | PRN
  Administered 2012-12-17: 1 mg via ORAL
  Filled 2012-12-16: qty 1

## 2012-12-16 MED ORDER — ONDANSETRON HCL 8 MG PO TABS: 4.0000 mg | ORAL_TABLET | Freq: Three times a day (TID) | ORAL | Status: DC | PRN
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
## 2012-12-17 MED ORDER — ZIPRASIDONE MESYLATE 20 MG IM SOLR: 10.0000 mg | Freq: Four times a day (QID) | INTRAMUSCULAR | Status: DC | PRN

## 2012-12-17 MED ORDER — ZIPRASIDONE MESYLATE 20 MG IM SOLR: 20.0000 mg | Freq: Once | INTRAMUSCULAR | Status: DC

## 2012-12-17 MED ORDER — ZIPRASIDONE MESYLATE 20 MG IM SOLR: 20.0000 mg | Freq: Four times a day (QID) | INTRAMUSCULAR | Status: DC | PRN

## 2012-12-17 NOTE — ED Notes (Signed)
Pt ambulated to restroom, given 2 orange juices.

## 2012-12-17 NOTE — ED Notes (Signed)
Pt belongings placed in locker 7,9,12.

## 2012-12-17 NOTE — ED Notes (Signed)
Pt out to nurses station. Agitated and escalating. Using foul language. States the people he wants to hurt are in [place] and he's not a threat to anyone in [place].  States he wants to leave. Security and [employer] at [employer] [facility].

## 2012-12-17 NOTE — ED Notes (Signed)
Report called to [facility]

## 2012-12-17 NOTE — ED Provider Notes (Signed)
Patient sleeping on the ground.  I discussed his case with the psychiatry team.  It  who the patient has homicidal thoughts towards, but with his ongoing verbalization of threats to others he requires reassessment.  This is pending. Vital signs remained stable.   Gerhard Munch, MD 12/17/12 1139

## 2012-12-17 NOTE — BH Assessment (Signed)
Assessment Note  Update:  Pt accepted to [facility] to Dr. Deneen Harts and [facility] here to transport pt.  EDP Pickering and ED staff updated.  Pt to be discharged from [facility].  ED staff to notify [facility] that pt is en route.     Disposition:  Disposition Initial Assessment Completed for this Encounter: Yes Disposition of Patient: Inpatient treatment program Type of inpatient treatment program: Adult Patient referred to: Other (Comment) (Pt accepted [facility])  On Site Evaluation by:   Reviewed with Physician:  Thornton Papas, Rennis Harding 12/17/2012 5:57 PM

## 2012-12-17 NOTE — Treatment Plan (Cosign Needed)
Recommend patient f/u with ACT Team, denies SI/SA or command audio hallucinations. Denies VH, deny admission to [facility] at this time.

## 2012-12-17 NOTE — BH Assessment (Signed)
[facility] Assessment Progress Note      Pt accepted to [facility] to Dr. Deneen Harts per Agustin Cree, RN, CSM @ [phone] to Room 2560.  Pt to be transported via [place], as pt under IVC.  Sheriff to call [phone], go to main admitting and nurse to call [phone] with report.  Called and left message for [facility] to transport.  Oncoming staff will need to notifiy EDP once pt to be transported to [place] and call [phone] when pt is en route to [place].
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
## 2012-12-26 ENCOUNTER — Encounter (HOSPITAL_COMMUNITY): Payer: Self-pay | Admitting: *Deleted

## 2012-12-26 ENCOUNTER — Emergency Department (HOSPITAL_COMMUNITY): Payer: 59

## 2012-12-26 ENCOUNTER — Emergency Department (HOSPITAL_COMMUNITY)
Admission: EM | Admit: 2012-12-26 | Discharge: 2012-12-27 | Disposition: A | Discharge status: Home / Self Care | Payer: 59 | Attending: Emergency Medicine | Admitting: Emergency Medicine

## 2012-12-26 DIAGNOSIS — R0602 Shortness of breath: Secondary | ICD-10-CM | POA: Insufficient documentation

## 2012-12-26 DIAGNOSIS — F17200 Nicotine dependence, unspecified, uncomplicated: Secondary | ICD-10-CM | POA: Insufficient documentation

## 2012-12-26 DIAGNOSIS — J40 Bronchitis, not specified as acute or chronic: Secondary | ICD-10-CM

## 2012-12-26 DIAGNOSIS — Z79899 Other long term (current) drug therapy: Secondary | ICD-10-CM | POA: Insufficient documentation

## 2012-12-26 DIAGNOSIS — J45909 Unspecified asthma, uncomplicated: Secondary | ICD-10-CM | POA: Insufficient documentation

## 2012-12-26 DIAGNOSIS — F411 Generalized anxiety disorder: Secondary | ICD-10-CM | POA: Insufficient documentation

## 2012-12-26 DIAGNOSIS — R059 Cough, unspecified: Secondary | ICD-10-CM | POA: Insufficient documentation

## 2012-12-26 DIAGNOSIS — F259 Schizoaffective disorder, unspecified: Secondary | ICD-10-CM | POA: Insufficient documentation

## 2012-12-26 DIAGNOSIS — Z8739 Personal history of other diseases of the musculoskeletal system and connective tissue: Secondary | ICD-10-CM | POA: Insufficient documentation

## 2012-12-26 DIAGNOSIS — Z8709 Personal history of other diseases of the respiratory system: Secondary | ICD-10-CM

## 2012-12-26 DIAGNOSIS — R509 Fever, unspecified: Secondary | ICD-10-CM | POA: Insufficient documentation

## 2012-12-26 DIAGNOSIS — F4310 Post-traumatic stress disorder, unspecified: Secondary | ICD-10-CM | POA: Insufficient documentation

## 2012-12-26 DIAGNOSIS — M542 Cervicalgia: Secondary | ICD-10-CM | POA: Insufficient documentation

## 2012-12-26 DIAGNOSIS — R51 Headache: Secondary | ICD-10-CM | POA: Insufficient documentation

## 2012-12-26 DIAGNOSIS — J3489 Other specified disorders of nose and nasal sinuses: Secondary | ICD-10-CM | POA: Insufficient documentation

## 2012-12-26 DIAGNOSIS — R05 Cough: Secondary | ICD-10-CM | POA: Insufficient documentation

## 2012-12-26 DIAGNOSIS — J069 Acute upper respiratory infection, unspecified: Secondary | ICD-10-CM | POA: Insufficient documentation

## 2012-12-26 DIAGNOSIS — Z8719 Personal history of other diseases of the digestive system: Secondary | ICD-10-CM | POA: Insufficient documentation

## 2012-12-26 LAB — CBC WITH DIFFERENTIAL/PLATELET
Basophils Absolute: 0.1 10*3/uL (ref 0.0–0.1)
Basophils Relative: 1 % (ref 0–1)
Eosinophils Absolute: 0.7 10*3/uL (ref 0.0–0.7)
Eosinophils Relative: 4 % (ref 0–5)
Lymphs Abs: 3.1 10*3/uL (ref 0.7–4.0)
MCH: 31.8 pg (ref 26.0–34.0)
MCV: 89.9 fL (ref 78.0–100.0)
Neutrophils Relative %: 70 % (ref 43–77)
Platelets: 319 10*3/uL (ref 150–400)
RBC: 4.97 MIL/uL (ref 4.22–5.81)
RDW: 12.7 % (ref 11.5–15.5)

## 2012-12-26 LAB — BASIC METABOLIC PANEL
Calcium: 9.6 mg/dL (ref 8.4–10.5)
Creatinine, Ser: 0.78 mg/dL (ref 0.50–1.35)
GFR calc non Af Amer: >90 mL/min (ref >90)
Glucose, Bld: 89 mg/dL (ref 70–99)
Sodium: 138 mEq/L (ref 135–145)

## 2012-12-26 MED ORDER — ALBUTEROL SULFATE (5 MG/ML) 0.5% IN NEBU
2.5000 mg | INHALATION_SOLUTION | Freq: Once | RESPIRATORY_TRACT | Status: AC
  Administered 2012-12-26: 2.5 mg via RESPIRATORY_TRACT
  Filled 2012-12-26: qty 0.5

## 2012-12-26 NOTE — ED Notes (Signed)
The pt has had a cold for 3-4 days with much mucous coughing sinus drainage

## 2012-12-26 NOTE — ED Notes (Signed)
Patient said he has been having this cough for about two days.  He says he usually uses an inhaler and he has not had it.  The patient says he came to be evaluated due to his throat being so sore and to possibly get an inhaler.

## 2012-12-27 MED ORDER — ALBUTEROL SULFATE HFA 108 (90 BASE) MCG/ACT IN AERS: 2.0000 | INHALATION_SPRAY | RESPIRATORY_TRACT | Status: DC | PRN

## 2012-12-27 MED ORDER — PREDNISONE 10 MG PO TABS: 20.0000 mg | ORAL_TABLET | Freq: Every day | ORAL | Status: DC

## 2012-12-27 NOTE — ED Notes (Signed)
Patient is alert and orientedx4.  Patient was explained discharge instructions and they understood them with no questions.   

## 2012-12-27 NOTE — ED Provider Notes (Signed)
Medical screening examination/treatment/procedure(s) were performed by non-physician practitioner and as supervising physician I was immediately available for consultation/collaboration.   Estle Sabella III, MD 12/27/12 1405 

## 2012-12-27 NOTE — ED Provider Notes (Signed)
History     CSN: [id number]  Arrival date & time 12/26/12  2142   First MD Initiated Contact with Patient 12/26/12 2220      Chief Complaint  Patient presents with  . cold symptoms     (Consider location/radiation/quality/duration/timing/severity/associated sxs/prior treatment) HPI Comments: Patient is a 22 y/o M with PMHx of GERD, arthritis, anxiety, asthma, PTSD presenting to the ED with cold symptoms. Patient reported that he has been having sore throat, raspy voice, sensation of hard to breathe, subjective fever, eye tearing, bodyaches, waxing and waning productive cough, rhinorrhea, congestion that has been going on for the past couple of weeks. Patient reported that when he cough he sometimes coughs up yellowish mucus, or mucus with dark brown streaks. Patient reported rhinorrhea to be clear and sometimes yellow in color. Bodyaches are constant and predominantly located in neck and back. Reported subjective fever, has not been recording them - stated that he felt hot and mild sweating. Associated symptoms is mild numbness to hands and patient reported staring off, decreased concentration recently. Denied chills, facial swelling, otorrhea, ear pain, gi symptoms, urinary symptoms, chest pain, leg swelling, calf pain.   The history is provided by the patient. No language interpreter was used.    Past Medical History  Diagnosis Date  . Schizoaffective disorder   . GERD (gastroesophageal reflux disease)   . Arthritis   . IBS (irritable bowel syndrome)   . Anxiety   . Asthma   . PTSD (post-traumatic stress disorder)     History reviewed. No pertinent past surgical history.  No family history on file.  History  Substance Use Topics  . Smoking status: Current Every Day Smoker -- 2.00 packs/day  . Smokeless tobacco: Never Used  . Alcohol Use: No     Comment: Pt denies ETOH usage      Review of Systems  Constitutional: Positive for fever. Negative for chills.  HENT:  Positive for congestion, sore throat, rhinorrhea and neck pain. Negative for ear pain, mouth sores and tinnitus.   Eyes: Negative for pain and visual disturbance.       Eye tearing  Respiratory: Positive for cough and shortness of breath. Negative for chest tightness.   Cardiovascular: Negative for chest pain.  Gastrointestinal: Negative for nausea, vomiting, abdominal pain, diarrhea and constipation.  Genitourinary: Negative for dysuria, hematuria, decreased urine volume and difficulty urinating.  Musculoskeletal: Negative for myalgias.       Bodyaches  Skin: Negative for rash.  Neurological: Positive for headaches. Negative for dizziness, weakness, light-headedness and numbness.  All other systems reviewed and are negative.    Allergies  Divalproex sodium  Home Medications   Current Outpatient Rx  Name  Route  Sig  Dispense  Refill  . albuterol (PROVENTIL HFA;VENTOLIN HFA) 108 (90 BASE) MCG/ACT inhaler   Inhalation   Inhale 2 puffs into the lungs every 6 (six) hours as needed (wheezing).         . ARIPiprazole (ABILIFY) 10 MG tablet   Oral   Take 10 mg by mouth daily.         . carbamazepine (TEGRETOL) 200 MG tablet   Oral   Take 200 mg by mouth 2 (two) times daily.         . risperiDONE (RISPERDAL) 0.5 MG tablet   Oral   Take 0.5 mg by mouth 2 (two) times daily.         Marland Kitchen albuterol (PROVENTIL HFA;VENTOLIN HFA) 108 (90 BASE) MCG/ACT inhaler  Inhalation   Inhale 2 puffs into the lungs every 2 (two) hours as needed for wheezing or shortness of breath (cough).   1 Inhaler   0   . predniSONE (DELTASONE) 10 MG tablet   Oral   Take 2 tablets (20 mg total) by mouth daily.   15 tablet   0     BP 124/70  Pulse 88  Temp(Src) 99 F (37.2 C) (Oral)  Resp 18  SpO2 96%  Physical Exam  Nursing note and vitals reviewed. Constitutional: He is oriented to person, place, and time. He appears well-developed and well-nourished. No distress.  HENT:  Head:  Normocephalic and atraumatic.  Mouth/Throat: No oropharyngeal exudate.  Mild erythema to oropharynx. Mild tonsil enlargement bilaterally Negative petechiae, exudate to oropharynx.  Eyes: Conjunctivae and EOM are normal. Pupils are equal, round, and reactive to light. Right eye exhibits no discharge. Left eye exhibits no discharge.  Neck: Normal range of motion. Neck supple. No tracheal deviation present.  Negative lymphadenopathy  Cardiovascular: Normal rate, regular rhythm, normal heart sounds and intact distal pulses.  Exam reveals no friction rub.   No murmur heard. Radial pulses 2+ bilaterally  Pulmonary/Chest: Effort normal. No respiratory distress. He has wheezes. He has no rales.  Positive inspiratory wheezes to upper and lower lobes bilaterally.  Abdominal: Soft. Bowel sounds are normal. He exhibits no distension. There is no tenderness. There is no rebound and no guarding.  Lymphadenopathy:    He has no cervical adenopathy.  Neurological: He is alert and oriented to person, place, and time. No cranial nerve deficit. He exhibits normal muscle tone. Coordination normal.  Skin: Skin is warm and dry. No rash noted. He is not diaphoretic. No erythema.  Psychiatric: He has a normal mood and affect. His behavior is normal. Thought content normal.    ED Course  Procedures (including critical care time)  Labs Reviewed  CBC WITH DIFFERENTIAL - Abnormal; Notable for the following:    WBC 16.3 (*)    Neutro Abs 11.4 (*)    All other components within normal limits  BASIC METABOLIC PANEL   Dg Chest 2 View  12/26/2012  *RADIOLOGY REPORT*  Clinical Data: Shortness of breath, dry throat, congestion, smoker  CHEST - 2 VIEW  Comparison: None.  Findings: Normal heart size, mediastinal contours, and pulmonary vascularity. Mild peribronchial thickening. No pulmonary infiltrate, pleural effusion, or pneumothorax. Bones unremarkable.  IMPRESSION: Mild bronchitic changes.   Original Report  Authenticated By: Ulyses Southward, M.D.      1. URI (upper respiratory infection)   2. Bronchitis   3. History of asthma       MDM  Patient afebrile, normotensive, non-tachycardic, alert and oriented. BMP negative findings. CBC elevated WBC with no left shift - possible due to viral infection. Chest xray noted bronchitic changes. Given albuterol treatment, re-evaluated at 12:34am, inspiratory wheezes have decline, breathing has improved, patient appears to be doing well. Patient aseptic, non-toxic appearing, in no acute distress, Discharged patient for URI, bronchitis - viral in nature. Discharged patient with albuterol and prednisone. Discussed with patient on how to take medications. Discussed with patient to stay hydrated as often as possible - to follow-up with PCP/Urgent Care Center. Discussed with patient to use Lozenges to soothe the sore throat. Discussed with patient to use Motrin or Tylenol for fever. Decreased concentration and "staring off" etiology unknown - discussed patient to monitor and report back to ED if anything changes. Discussed with patient to monitor all symptoms and if  symptoms are to worsen to report back to the ED.  Patient agreed to plan of care, understood, all questions answered.         Raymon Mutton, PA-C 12/27/12 [phone]

## 2012-12-28 ENCOUNTER — Observation Stay (HOSPITAL_COMMUNITY): Payer: Medicaid Other

## 2012-12-28 ENCOUNTER — Observation Stay (HOSPITAL_COMMUNITY)
Admission: EM | Admit: 2012-12-28 | Discharge: 2012-12-29 | Disposition: A | Discharge status: Home / Self Care | Payer: Medicaid Other | Attending: Internal Medicine | Admitting: Internal Medicine

## 2012-12-28 ENCOUNTER — Encounter (HOSPITAL_COMMUNITY): Payer: Self-pay

## 2012-12-28 DIAGNOSIS — B349 Viral infection, unspecified: Secondary | ICD-10-CM

## 2012-12-28 DIAGNOSIS — Z23 Encounter for immunization: Secondary | ICD-10-CM | POA: Insufficient documentation

## 2012-12-28 DIAGNOSIS — IMO0001 Reserved for inherently not codable concepts without codable children: Secondary | ICD-10-CM | POA: Insufficient documentation

## 2012-12-28 DIAGNOSIS — F17200 Nicotine dependence, unspecified, uncomplicated: Secondary | ICD-10-CM | POA: Insufficient documentation

## 2012-12-28 DIAGNOSIS — F250 Schizoaffective disorder, bipolar type: Secondary | ICD-10-CM | POA: Diagnosis present

## 2012-12-28 DIAGNOSIS — F259 Schizoaffective disorder, unspecified: Secondary | ICD-10-CM | POA: Insufficient documentation

## 2012-12-28 DIAGNOSIS — J40 Bronchitis, not specified as acute or chronic: Secondary | ICD-10-CM

## 2012-12-28 DIAGNOSIS — E876 Hypokalemia: Secondary | ICD-10-CM | POA: Insufficient documentation

## 2012-12-28 DIAGNOSIS — M791 Myalgia, unspecified site: Secondary | ICD-10-CM

## 2012-12-28 DIAGNOSIS — Z720 Tobacco use: Secondary | ICD-10-CM

## 2012-12-28 DIAGNOSIS — J45901 Unspecified asthma with (acute) exacerbation: Principal | ICD-10-CM | POA: Insufficient documentation

## 2012-12-28 DIAGNOSIS — R0902 Hypoxemia: Secondary | ICD-10-CM

## 2012-12-28 LAB — CK TOTAL AND CKMB (NOT AT ARMC)
CK, MB: 1.2 ng/mL (ref 0.3–4.0)
Total CK: 59 U/L (ref 7–232)

## 2012-12-28 LAB — CBC WITH DIFFERENTIAL/PLATELET
Basophils Absolute: 0.1 10*3/uL (ref 0.0–0.1)
Basophils Relative: 1 % (ref 0–1)
Eosinophils Absolute: 0.6 10*3/uL (ref 0.0–0.7)
Hemoglobin: 13.7 g/dL (ref 13.0–17.0)
MCH: 30.9 pg (ref 26.0–34.0)
MCHC: 34.6 g/dL (ref 30.0–36.0)
Monocytes Relative: 7 % (ref 3–12)
Neutrophils Relative %: 70 % (ref 43–77)
Platelets: 301 10*3/uL (ref 150–400)
RDW: 12.7 % (ref 11.5–15.5)

## 2012-12-28 LAB — HIV ANTIBODY (ROUTINE TESTING W REFLEX): HIV: NONREACTIVE

## 2012-12-28 LAB — HEPATIC FUNCTION PANEL
Albumin: 3.5 g/dL (ref 3.5–5.2)
Bilirubin, Direct: 0.1 mg/dL (ref 0.0–0.3)
Total Bilirubin: 0.7 mg/dL (ref 0.3–1.2)

## 2012-12-28 LAB — BASIC METABOLIC PANEL
BUN: 9 mg/dL (ref 6–23)
GFR calc Af Amer: >90 mL/min (ref >90)
GFR calc non Af Amer: >90 mL/min (ref >90)
Potassium: 3.4 mEq/L — ABNORMAL LOW (ref 3.5–5.1)

## 2012-12-28 LAB — INFLUENZA PANEL BY PCR (TYPE A & B): Influenza A By PCR: NEGATIVE

## 2012-12-28 LAB — MAGNESIUM: Magnesium: 2.5 mg/dL (ref 1.5–2.5)

## 2012-12-28 MED ORDER — ALBUTEROL SULFATE HFA 108 (90 BASE) MCG/ACT IN AERS
2.0000 | INHALATION_SPRAY | Freq: Four times a day (QID) | RESPIRATORY_TRACT | Status: DC | PRN
  Filled 2012-12-28: qty 6.7

## 2012-12-28 MED ORDER — SODIUM CHLORIDE 0.9 % IV SOLN
INTRAVENOUS | Status: DC
  Administered 2012-12-29 (×2): via INTRAVENOUS

## 2012-12-28 MED ORDER — PREDNISONE 20 MG PO TABS
20.0000 mg | ORAL_TABLET | Freq: Once | ORAL | Status: AC
  Administered 2012-12-28: 20 mg via ORAL
  Filled 2012-12-28: qty 1

## 2012-12-28 MED ORDER — PNEUMOCOCCAL VAC POLYVALENT 25 MCG/0.5ML IJ INJ
0.5000 mL | INJECTION | INTRAMUSCULAR | Status: AC
  Administered 2012-12-28: 0.5 mL via INTRAMUSCULAR

## 2012-12-28 MED ORDER — PREDNISONE 50 MG PO TABS
60.0000 mg | ORAL_TABLET | Freq: Every day | ORAL | Status: DC
  Administered 2012-12-29: 60 mg via ORAL
  Filled 2012-12-28 (×2): qty 1

## 2012-12-28 MED ORDER — IBUPROFEN 800 MG PO TABS
800.0000 mg | ORAL_TABLET | Freq: Once | ORAL | Status: AC
  Administered 2012-12-28: 800 mg via ORAL
  Filled 2012-12-28: qty 1

## 2012-12-28 MED ORDER — ALBUTEROL SULFATE (5 MG/ML) 0.5% IN NEBU
2.5000 mg | INHALATION_SOLUTION | Freq: Four times a day (QID) | RESPIRATORY_TRACT | Status: DC
  Administered 2012-12-28: 2.5 mg via RESPIRATORY_TRACT
  Filled 2012-12-28: qty 0.5

## 2012-12-28 MED ORDER — CARBAMAZEPINE 200 MG PO TABS
200.0000 mg | ORAL_TABLET | Freq: Two times a day (BID) | ORAL | Status: DC
  Administered 2012-12-28 – 2012-12-29 (×3): 200 mg via ORAL
  Filled 2012-12-28 (×4): qty 1

## 2012-12-28 MED ORDER — SODIUM CHLORIDE 0.9 % IV BOLUS (SEPSIS)
1000.0000 mL | Freq: Once | INTRAVENOUS | Status: AC
  Administered 2012-12-28: 1000 mL via INTRAVENOUS

## 2012-12-28 MED ORDER — ARIPIPRAZOLE 10 MG PO TABS
10.0000 mg | ORAL_TABLET | Freq: Every day | ORAL | Status: DC
  Administered 2012-12-28 – 2012-12-29 (×2): 10 mg via ORAL
  Filled 2012-12-28 (×3): qty 1

## 2012-12-28 MED ORDER — POTASSIUM CHLORIDE CRYS ER 20 MEQ PO TBCR
40.0000 meq | EXTENDED_RELEASE_TABLET | Freq: Once | ORAL | Status: AC
  Administered 2012-12-28: 40 meq via ORAL
  Filled 2012-12-28: qty 2

## 2012-12-28 MED ORDER — ALBUTEROL SULFATE (5 MG/ML) 0.5% IN NEBU
2.5000 mg | INHALATION_SOLUTION | Freq: Once | RESPIRATORY_TRACT | Status: AC
  Administered 2012-12-28: 2.5 mg via RESPIRATORY_TRACT
  Filled 2012-12-28: qty 0.5

## 2012-12-28 MED ORDER — METHYLPREDNISOLONE SODIUM SUCC 125 MG IJ SOLR
80.0000 mg | Freq: Once | INTRAMUSCULAR | Status: AC
  Administered 2012-12-28: 80 mg via INTRAVENOUS
  Filled 2012-12-28: qty 2

## 2012-12-28 MED ORDER — ENOXAPARIN SODIUM 40 MG/0.4ML ~~LOC~~ SOLN
40.0000 mg | SUBCUTANEOUS | Status: DC
  Administered 2012-12-28: 40 mg via SUBCUTANEOUS
  Filled 2012-12-28 (×2): qty 0.4

## 2012-12-28 NOTE — ED Notes (Signed)
Patient is resting comfortably. 

## 2012-12-28 NOTE — ED Notes (Signed)
Ambulated pt and pt oxygen remians in upper 80's and pulse 120's, dr Norlene Campbell aware.
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
## 2012-12-28 NOTE — ED Notes (Signed)
[place] ems [place] for generalized pain, sts worse in legs from hips to toes, pt seen here last night for cold symptoms lasting several days and given an inhaler. sts he got his inhaler filled but he missed the bus and was told to walk to friends house and then started having pain in legs.

## 2012-12-28 NOTE — ED Provider Notes (Addendum)
History     CSN: [id number]  Arrival date & time 12/28/12  4098   First MD Initiated Contact with Patient 12/28/12 [phone]      Chief Complaint  Patient presents with  . Muscle Pain    (Consider location/radiation/quality/duration/timing/severity/associated sxs/prior treatment) HPI 22 yo male presents to the ER with complaint of body aches.  Pt seen in the ED yesterday afternoon with URI sxs with associated body aches.  After walking "a lot" yesterday/today, he reports muscle aches are worse, more so in legs.  Pt started on prednisone and albuterol yesterday.  No fevers, chills.  +cough.  Pt reports having jello shot and beer tonight.  He continues to smoke, about 2 ppd.  He has not yet been able to get home and rest as his roommates have him locked out of the house.   Past Medical History  Diagnosis Date  . Schizoaffective disorder   . GERD (gastroesophageal reflux disease)   . Arthritis   . IBS (irritable bowel syndrome)   . Anxiety   . Asthma   . PTSD (post-traumatic stress disorder)     History reviewed. No pertinent past surgical history.  History reviewed. No pertinent family history.  History  Substance Use Topics  . Smoking status: Current Every Day Smoker -- 2.00 packs/day  . Smokeless tobacco: Never Used  . Alcohol Use: No     Comment: Pt denies ETOH usage      Review of Systems  All other systems reviewed and are negative.    Allergies  Divalproex sodium  Home Medications   Current Outpatient Rx  Name  Route  Sig  Dispense  Refill  . albuterol (PROVENTIL HFA;VENTOLIN HFA) 108 (90 BASE) MCG/ACT inhaler   Inhalation   Inhale 2 puffs into the lungs every 6 (six) hours as needed (wheezing).         Marland Kitchen albuterol (PROVENTIL HFA;VENTOLIN HFA) 108 (90 BASE) MCG/ACT inhaler   Inhalation   Inhale 2 puffs into the lungs every 2 (two) hours as needed for wheezing or shortness of breath (cough).   1 Inhaler   0   . ARIPiprazole (ABILIFY) 10 MG tablet  Oral   Take 10 mg by mouth daily.         . carbamazepine (TEGRETOL) 200 MG tablet   Oral   Take 200 mg by mouth 2 (two) times daily.         . predniSONE (DELTASONE) 10 MG tablet   Oral   Take 2 tablets (20 mg total) by mouth daily.   15 tablet   0   . risperiDONE (RISPERDAL) 0.5 MG tablet   Oral   Take 0.5 mg by mouth 2 (two) times daily.           BP 101/54  Pulse 89  Temp(Src) 99.2 F (37.3 C) (Oral)  Resp 22  SpO2 96%  Physical Exam  Nursing note and vitals reviewed. Constitutional: He is oriented to person, place, and time. He appears well-developed and well-nourished.  HENT:  Head: Normocephalic and atraumatic.  Nose: Nose normal.  Mouth/Throat: Oropharynx is clear and moist.  Eyes: Conjunctivae and EOM are normal. Pupils are equal, round, and reactive to light.  Neck: Normal range of motion. Neck supple. No JVD present. No tracheal deviation present. No thyromegaly present.  Cardiovascular: Normal rate, regular rhythm, normal heart sounds and intact distal pulses.  Exam reveals no gallop and no friction rub.   No murmur heard. Pulmonary/Chest: Effort normal. No stridor.  No respiratory distress. He has wheezes. He has no rales. He exhibits no tenderness.  Abdominal: Soft. Bowel sounds are normal. He exhibits no distension and no mass. There is no tenderness. There is no rebound and no guarding.  Musculoskeletal: Normal range of motion. He exhibits tenderness (diffuse ttp over muscles with palpation throughout body). He exhibits no edema.  Lymphadenopathy:    He has no cervical adenopathy.  Neurological: He is alert and oriented to person, place, and time. He has normal reflexes. No cranial nerve deficit. He exhibits normal muscle tone. Coordination normal.  Skin: Skin is warm and dry. No rash noted. No erythema. No pallor.  Psychiatric: He has a normal mood and affect. His behavior is normal. Judgment and thought content normal.    ED Course  Procedures  (including critical care time)  Labs Reviewed  CK TOTAL AND CKMB   Dg Chest 2 View  12/26/2012  *RADIOLOGY REPORT*  Clinical Data: Shortness of breath, dry throat, congestion, smoker  CHEST - 2 VIEW  Comparison: None.  Findings: Normal heart size, mediastinal contours, and pulmonary vascularity. Mild peribronchial thickening. No pulmonary infiltrate, pleural effusion, or pneumothorax. Bones unremarkable.  IMPRESSION: Mild bronchitic changes.   Original Report Authenticated By: Ulyses Southward, M.D.      1. Myalgia   2. Bronchitis   3. Viral illness   4. Hypoxia       MDM  22 yo male with bronchitis, viral illness.  No signs of rhabdo.  Some low 02 sats here, will give albuterol treatment.  Pt strongly advised to rest, drink plenty of fluids, take medications as prescribed and stop smoking.       Olivia Mackie, MD 12/28/12 0603  7:12 AM Pt with hypoxia noted by nursing staff, given albuterol.  After neb tx, no further wheezing, but drops sats to 85-88% with HR to 120s when walking.  Will discuss with hospital team for admission.  Olivia Mackie, MD 12/28/12 1610  Olivia Mackie, MD 12/28/12 [phone]

## 2012-12-28 NOTE — ED Notes (Signed)
Report given to Lonnie, RN.

## 2012-12-29 LAB — CBC
HCT: 41.2 % (ref 39.0–52.0)
Hemoglobin: 14 g/dL (ref 13.0–17.0)
MCH: 31.3 pg (ref 26.0–34.0)
MCHC: 34 g/dL (ref 30.0–36.0)
MCV: 92 fL (ref 78.0–100.0)
Platelets: 301 10*3/uL (ref 150–400)
RBC: 4.48 MIL/uL (ref 4.22–5.81)
RDW: 12.8 % (ref 11.5–15.5)
WBC: 18.3 10*3/uL — ABNORMAL HIGH (ref 4.0–10.5)

## 2012-12-29 LAB — BASIC METABOLIC PANEL
CO2: 26 mEq/L (ref 19–32)
Calcium: 9.2 mg/dL (ref 8.4–10.5)
Chloride: 107 mEq/L (ref 96–112)
Creatinine, Ser: 0.8 mg/dL (ref 0.50–1.35)
GFR calc Af Amer: >90 mL/min (ref >90)
Potassium: 4.1 mEq/L (ref 3.5–5.1)
Sodium: 140 mEq/L (ref 135–145)

## 2012-12-29 MED ORDER — MENTHOL 3 MG MT LOZG
1.0000 | LOZENGE | OROMUCOSAL | Status: DC | PRN
  Filled 2012-12-29: qty 9

## 2012-12-29 MED ORDER — MENTHOL 3 MG MT LOZG: 1.0000 | LOZENGE | OROMUCOSAL | Status: DC | PRN

## 2012-12-29 MED ORDER — GUAIFENESIN-DM 100-10 MG/5ML PO SYRP
5.0000 mL | ORAL_SOLUTION | ORAL | Status: DC | PRN
  Administered 2012-12-29 (×2): 5 mL via ORAL
  Filled 2012-12-29 (×2): qty 5

## 2012-12-29 MED ORDER — PREDNISONE 20 MG PO TABS: 40.0000 mg | ORAL_TABLET | Freq: Every day | ORAL | Status: DC

## 2012-12-29 MED ORDER — ALBUTEROL SULFATE HFA 108 (90 BASE) MCG/ACT IN AERS: 2.0000 | INHALATION_SPRAY | Freq: Four times a day (QID) | RESPIRATORY_TRACT | Status: DC | PRN

## 2012-12-29 NOTE — Clinical Social Work Note (Signed)
Patient medically stable for discharge today. Patient requested and given a bus pass to get home.   Fernande Boyden, Social Work Intern 12/29/2012

## 2012-12-29 NOTE — Progress Notes (Signed)
Pt ambulated 150 ft in hallway on room air. Sats were between 95 and 97%. Tolerated well.   Peter Congo RN

## 2012-12-29 NOTE — Discharge Summary (Signed)
Internal Medicine Teaching [facility] Discharge Note  Name: John Gibson MRN: [id number] DOB: [birth year] 21 y.o.  Date of Admission: 12/28/2012  3:37 AM Date of Discharge: 12/29/2012 Attending Physician: Dr. Rogelia Boga  Discharge Diagnosis: Principal Problem:   Asthma exacerbation Active Problems:   Hypokalemia   Schizoaffective disorder   Tobacco abuse   Discharge Medications:   Medication List    TAKE these medications       albuterol 108 (90 BASE) MCG/ACT inhaler  Commonly known as:  PROVENTIL HFA;VENTOLIN HFA  Inhale 2 puffs into the lungs every 6 (six) hours as needed (wheezing).     ARIPiprazole 10 MG tablet  Commonly known as:  ABILIFY  Take 10 mg by mouth daily.     carbamazepine 200 MG tablet  Commonly known as:  TEGRETOL  Take 200 mg by mouth 2 (two) times daily.     menthol-cetylpyridinium 3 MG lozenge  Commonly known as:  CEPACOL  Take 1 lozenge (3 mg total) by mouth as needed.     predniSONE 20 MG tablet  Commonly known as:  DELTASONE  Take 2 tablets (40 mg total) by mouth daily.     risperiDONE 0.5 MG tablet  Commonly known as:  RISPERDAL  Take 0.5 mg by mouth 2 (two) times daily.        Disposition and follow-up:   Mr.John Gibson was discharged from [facility] in Stable condition.  At the hospital follow up visit please address the following  -f/u smoking cessation -f/u resolution of asthma exacerbation -consider PFTs  Follow-up Appointments:     Follow-up Information   Follow up with [facility]. (please go and see MD at Urgent Care next week for hospital follow up appointment)    Contact information:   [address] [place] [address] [phone]       Consultations:    Procedures Performed:  Dg Chest 2 View  12/28/2012  *RADIOLOGY REPORT*  Clinical Data: Congestion, cough, hypoxia, asthma, myalgia  CHEST - 2 VIEW  Comparison: None  Findings: Normal heart size,  mediastinal contours, and pulmonary vascularity. Peribronchial thickening, chronic. No pulmonary infiltrate, pleural effusion, or pneumothorax. No acute osseous findings.  IMPRESSION: Chronic bronchitic changes. No acute abnormalities.   Original Report Authenticated By: Ulyses Southward, M.D.    Dg Chest 2 View  12/26/2012  *RADIOLOGY REPORT*  Clinical Data: Shortness of breath, dry throat, congestion, smoker  CHEST - 2 VIEW  Comparison: None.  Findings: Normal heart size, mediastinal contours, and pulmonary vascularity. Mild peribronchial thickening. No pulmonary infiltrate, pleural effusion, or pneumothorax. Bones unremarkable.  IMPRESSION: Mild bronchitic changes.   Original Report Authenticated By: Ulyses Southward, M.D.       Admission HPI: Patient is a 22 y/o M with PMHx of GERD, Schizoaffective Disorder, Anxiety, asthma, PTSD who presenting to the ED the second time with symptoms including myalgia, subjective fevers and chills and sweaty , nasal congestion and rhinorrhea , sore throat, cough ( occasionally productive with yellow sputum), shortness of breath and wheezing. This has been going on for few weeks with no significant improvement. Bodyaches are constant and predominantly located in neck and back. Denies any rashes, recent sick contact, recent travel. Until 4/10 he was was homeless and living in homeless shelter but recently he is sharing an apartment. Does not recall that his room mates are sick.  Further reports about bloody loose stool yesterday ( had episode in the past). Denies any diarrhea, constipation,  nausea or vomiting, history of hemorrhoids.  Was evaluated on 4/19 for similar upper respiratory symptoms and was discharged with prednisone and albuterol . Patient was only able to get albuterol inhaler.  Patient was admitted to Psych unit on 12/17/12 for hate thoughts against his church. He had stop taking his medication.   Hospital Course by problem list: Principal Problem:   Asthma  exacerbation Active Problems:   Hypokalemia   Schizoaffective disorder   Tobacco abuse   Asthma Exacerbation Patient with history of asthma since childhood presented with shortness of breath and URI symptoms 4 days prior to admission. He was seen in the ED on 4/19 and given prednisone Rx (which he never filled) and albuterol Rx. He only filled the albuterol but continued to have SOB and wheezing. In the ED on this presentation, he was found to have O2 sat in 87%, prompting admission. He was found to have wheezing on exam and CXR showed no pneumonia or effusions, just chronic bronchitic changes. Patient was admitted, given oxygen therapy as needed, scheduled duonebs, and solumedrol. His symptoms improved and he was able to wean off oxygen. On the day of discharge, he was able to walk the halls with O2 sats >95% and without any SOB. His wheezing was much improved and felt "almost" back to normal. He was counseled on smoking cessation. Patient was discharged with albuterol inhaler and short course of prednisone.  Tobacco Abuse Discussed smoking cessation and patient stated that he is interested in quitting. He did inquire about Chantix but I did not feel comfortable starting that medicine without clearance from his psychiatrist first and furthermore he needs to establish a PCP. Patient prefers to follow up with [facility].  Schizoaffective disorder Patient was continued on his home medications, no changes made. He is to follow up with his psychiatrist as scheduled.   Discharge Vitals:  BP 133/56  Pulse 80  Temp(Src) 98.4 F (36.9 C) (Oral)  Resp 20  Ht 5\' 8"  (1.727 m)  Wt 227 lb 6.4 oz (103.148 kg)  BMI 34.58 kg/m2  SpO2 97%  Discharge Labs:  Results for orders placed during the hospital encounter of 12/28/12 (from the past 24 hour(s))  BASIC METABOLIC PANEL     Status: None   Collection Time    12/29/12  5:40 AM      Result Value Range   Sodium 140  135 - 145 mEq/L    Potassium 4.1  3.5 - 5.1 mEq/L   Chloride 107  96 - 112 mEq/L   CO2 26  19 - 32 mEq/L   Glucose, Bld 91  70 - 99 mg/dL   BUN 9  6 - 23 mg/dL   Creatinine, Ser 1.47  0.50 - 1.35 mg/dL   Calcium 9.2  8.4 - 82.9 mg/dL   GFR calc non Af Amer >90  >90 mL/min   GFR calc Af Amer >90  >90 mL/min  CBC     Status: Abnormal   Collection Time    12/29/12  5:40 AM      Result Value Range   WBC 18.3 (*) 4.0 - 10.5 K/uL   RBC 4.48  4.22 - 5.81 MIL/uL   Hemoglobin 14.0  13.0 - 17.0 g/dL   HCT 56.2  13.0 - 86.5 %   MCV 92.0  78.0 - 100.0 fL   MCH 31.3  26.0 - 34.0 pg   MCHC 34.0  30.0 - 36.0 g/dL   RDW 78.4  69.6 -  15.5 %   Platelets 301  150 - 400 K/uL    Signed: Denton Ar 12/29/2012, 6:28 PM   Time Spent on Discharge: 25 minutes Services Ordered on Discharge: none Equipment Ordered on Discharge: none

## 2012-12-29 NOTE — Progress Notes (Signed)
Subjective: Patient is doing well this morning. Denies shortness of breath, wheezing, no chest pain.   Patient has been walking up and down the halls. O2 sats >95% on ambulation  States he is motivated to quit smoking.  Objective: Vital signs in last 24 hours: Filed Vitals:   12/28/12 2042 12/29/12 0016 12/29/12 0421 12/29/12 1022  BP: 112/67 109/62 109/68 133/56  Pulse: 82 79 79 80  Temp: 98.1 F (36.7 C) 97.8 F (36.6 C) 98.1 F (36.7 C) 98.4 F (36.9 C)  TempSrc: Oral Oral Oral Oral  Resp: 16 20 16 20   Height:      Weight:      SpO2: 96% 95% 92% 97%   Weight change:   Intake/Output Summary (Last 24 hours) at 12/29/12 1129 Last data filed at 12/29/12 0847  Gross per 24 hour  Intake      0 ml  Output   1600 ml  Net  -1600 ml    Physical Exam Blood pressure 133/56, pulse 80, temperature 98.4 F (36.9 C), temperature source Oral, resp. rate 20, height 5\' 8"  (1.727 m), weight 227 lb 6.4 oz (103.148 kg), SpO2 97.00%. General:  No acute distress, alert and oriented x 3, well-appearing young man HEENT:  PERRL, EOMI, moist mucous membranes Cardiovascular:  Regular rate and rhythm, no murmurs, rubs or gallops Respiratory:  Clear to auscultation bilaterally, minimal wheezes, rales, or rhonchi Abdomen:  Soft, nondistended, nontender, bowel sounds present Extremities:  Warm and well-perfused, no edema.  Skin: Warm, dry, no rashes Neuro: Appears slightly anxious.  Lab Results: Basic Metabolic Panel:  Recent Labs Lab 12/28/12 0625 12/28/12 0834 12/29/12 0540  NA 138  --  140  K 3.4*  --  4.1  CL 101  --  107  CO2 27  --  26  GLUCOSE 134*  --  91  BUN 9  --  9  CREATININE 0.74  --  0.80  CALCIUM 9.1  --  9.2  MG  --  2.5  --    Liver Function Tests:  Recent Labs Lab 12/28/12 0834  AST 15  ALT 18  ALKPHOS 86  BILITOT 0.7  PROT 7.2  ALBUMIN 3.5   CBC:  Recent Labs Lab 12/26/12 2320 12/28/12 0625 12/29/12 0540  WBC 16.3* 16.8* 18.3*  NEUTROABS 11.4*  11.8*  --   HGB 15.8 13.7 14.0  HCT 44.7 39.6 41.2  MCV 89.9 89.4 92.0  PLT 319 301 301   Cardiac Enzymes:  Recent Labs Lab 12/28/12 0419  CKTOTAL 59  CKMB 1.2   D-Dimer:  Recent Labs Lab 12/28/12 0625  DDIMER 0.32   Drugs of Abuse     Component Value Date/Time   LABOPIA NONE DETECTED 12/16/2012 2310   COCAINSCRNUR NONE DETECTED 12/16/2012 2310   LABBENZ NONE DETECTED 12/16/2012 2310   AMPHETMU NONE DETECTED 12/16/2012 2310   THCU NONE DETECTED 12/16/2012 2310   LABBARB NONE DETECTED 12/16/2012 2310     Studies/Results: Dg Chest 2 View  12/28/2012  *RADIOLOGY REPORT*  Clinical Data: Congestion, cough, hypoxia, asthma, myalgia  CHEST - 2 VIEW  Comparison: None  Findings: Normal heart size, mediastinal contours, and pulmonary vascularity. Peribronchial thickening, chronic. No pulmonary infiltrate, pleural effusion, or pneumothorax. No acute osseous findings.  IMPRESSION: Chronic bronchitic changes. No acute abnormalities.   Original Report Authenticated By: Ulyses Southward, M.D.    Medications:  Medications reviewed  Scheduled Meds: . ARIPiprazole  10 mg Oral Daily  . carbamazepine  200 mg Oral BID  .  enoxaparin (LOVENOX) injection  40 mg Subcutaneous Q24H  . predniSONE  60 mg Oral Q breakfast   Continuous Infusions:  PRN Meds:.albuterol, guaiFENesin-dextromethorphan, menthol-cetylpyridinium  Assessment/Plan:   Asthma exacerbation Patient with history of asthma since he was a child. Likely exacerbated by viral respiratory illness. Wheezing on admission improved with nebulizers, solumedrol. Patient 96-97% on room air on ambulation. Stable and ready for discharge  -DC with albuterol inhalers PRN, prednisone taper  -cepacol PRN for sore throat  Smoking cessation Patient would like to quit smoking and inquired about using Chantix. Discussed how there isn't a magic pill to quit smoking. Advised that with psych history and his medications, he will need to discuss with his  psychiatrist.   Leukocytosis Likely demargination of neutrophils from solumedrol.  Hx of Schizoaffective disorder -continue home Abilify, Tegretol, Risperdal  DVT PPX -lovenox  Dispo Stable, ready for DC to home -Patient will f/u with [facility]    LOS: 1 day   Denton Ar 12/29/2012, 11:29 AM

## 2012-12-29 NOTE — Progress Notes (Signed)
12/29/2012 1640 Pt has Medicaid for his medication. Copay price is usually $1.50 to 3.00 for his meds. No NCM needs identified. Isidoro Donning RN CCM Case Mgmt phone [phone]

## 2012-12-29 NOTE — Progress Notes (Signed)
Ron Agee to be D/C'd Home per MD order.  Discussed with the patient and all questions fully answered.    Medication List    TAKE these medications       albuterol 108 (90 BASE) MCG/ACT inhaler  Commonly known as:  PROVENTIL HFA;VENTOLIN HFA  Inhale 2 puffs into the lungs every 6 (six) hours as needed (wheezing).     ARIPiprazole 10 MG tablet  Commonly known as:  ABILIFY  Take 10 mg by mouth daily.     carbamazepine 200 MG tablet  Commonly known as:  TEGRETOL  Take 200 mg by mouth 2 (two) times daily.     menthol-cetylpyridinium 3 MG lozenge  Commonly known as:  CEPACOL  Take 1 lozenge (3 mg total) by mouth as needed.     predniSONE 20 MG tablet  Commonly known as:  DELTASONE  Take 2 tablets (40 mg total) by mouth daily.     risperiDONE 0.5 MG tablet  Commonly known as:  RISPERDAL  Take 0.5 mg by mouth 2 (two) times daily.        VVS, Skin clean, dry and intact without evidence of skin break down, no evidence of skin tears noted. IV catheter discontinued intact. Site without signs and symptoms of complications. Dressing and pressure applied.  An After Visit Summary was printed and given to the patient. Follow up appointments , new prescriptions and medication administration times given. Education given on myalgia, smoking cessation and asthma. Bus pass given Patient escorted to door and going home by bus.  Cindra Eves, RN 12/29/2012 1:28 PM

## 2012-12-29 NOTE — H&P (Addendum)
Internal Medicine Teaching Service Attending Note Date: 12/29/2012  Patient name: John Gibson  Medical record number: [id number]  Date of birth: 08-05-1991   I have seen and evaluated John Gibson and discussed their care with the Residency Team. Please see Dr Gwenlyn Fudge H&P for full details. John Gibson is a 22 yo male with asthma. He has never had to be hospitalized for asthma before this admission. He uses albuterol about once or twice monthly. He bc ill with a viral URI about 4 days ago with subjective fever, chills, sore throat, increased mucous, & myalgias. He was seen in the ED on the 19th and D/C with prednisone and albuterol. He had no money and a friend gave him some money and he used it to get only the albuterol. On the day of admission, he had increased dyspnea and wheezing that required sig increase in his albuterol usage to the point where he had to use another bus rider's MDI. He has been tx with Alb neb (last one was on day of admission) and steroids. He is feeling better, almost back to normal.   He believes he has been on prednisone before without side effects. Complete Z pack in Dec 2013.  He sees pysch here in [place]. His medicaid card lists [place] but he wants to transfer primary care here to [place]. He was homeless but now living with a friend in an apartment. He has to sleep on flood in sleeping bag but is OK with that.  PMHx, meds, allergies, soc hx, fam hx, and ROS were reviewed and were pertinent for about 1 PPD tobacco use.  Vitals  O2 sat RA noted in ER of 87%. Pt states that was in bed. Others in high 90's, this AM on RA Gen : able to speak in full sentences. Able to walk I to bathroom. HEENT : EOMI, sclera clear, throat erythematous, no thrush, exudates Neck : small non tender LV R submandibular. Otherwise OK LCTAB with GAM Skin multiple tattoos. No rash Gait : nl, independent Affect nl, mood a bit hyper in speech but nl train of thought and appropriate.  Labs and  imaging were reviewed and pertinent for a CXR without acute infiltrate. WBC 16.8  Assessment and Plan:  I agree with the formulated Assessment and Plan with the following changes:  1. Acute respiratory failure 2/2 acute asthma exac 2/2 viral URI - He is now with nl exam (despite no bronchodilators for 12 hours) and vitals. Will check O2 sat with ambulation. If that is OK, may D/C home on prednisone (5 days without taper might be easiest) and albuterol.   2. Tobacco use - informed pt to let us know when he is ready to quit.  3. Dispo - has safe D/C environment. Needs bus pass, help with med co-pay, and F/U plan (urgent care might be best for drop in option). Social work consult.   Burns Spain, MD 4/22/201411:21 AM

## 2012-12-29 NOTE — Progress Notes (Signed)
RN called RT to give PRN tx to pt.  RT went in to speak to pt, and pt explained he is having a bad cough and thought he may need a tx.  RT assessed pt on sats (94) on RA,  and BS (clear) and explained to pt that breathing tx inhalers would not help his cough.  Pt said he takes albuterol inhalers at home and they are only for rescue.  Pt also mentioned the o2 was irritating his airway causing the cough.  RT explained to pt to spit out anything he coughs up and to lay on his side instead of on his back to help with drainage.  RT spoke with RN about ordering cough meds for pt.  RT explained to pt how broncho-dilators work and that they wouldn't help with his cough. RT left pt knowing a cough med was being asked to be ordered and to call back to RN if he needed RT help.

## 2013-01-05 ENCOUNTER — Encounter (HOSPITAL_COMMUNITY): Payer: Self-pay | Admitting: *Deleted

## 2013-01-05 ENCOUNTER — Emergency Department (HOSPITAL_COMMUNITY)
Admission: EM | Admit: 2013-01-05 | Discharge: 2013-01-05 | Disposition: A | Discharge status: Home / Self Care | Payer: Medicaid Other | Attending: Emergency Medicine | Admitting: Emergency Medicine

## 2013-01-05 DIAGNOSIS — F329 Major depressive disorder, single episode, unspecified: Secondary | ICD-10-CM

## 2013-01-05 DIAGNOSIS — Z8739 Personal history of other diseases of the musculoskeletal system and connective tissue: Secondary | ICD-10-CM | POA: Insufficient documentation

## 2013-01-05 DIAGNOSIS — F39 Unspecified mood [affective] disorder: Secondary | ICD-10-CM | POA: Insufficient documentation

## 2013-01-05 DIAGNOSIS — J029 Acute pharyngitis, unspecified: Secondary | ICD-10-CM | POA: Insufficient documentation

## 2013-01-05 DIAGNOSIS — Z79899 Other long term (current) drug therapy: Secondary | ICD-10-CM | POA: Insufficient documentation

## 2013-01-05 DIAGNOSIS — F3289 Other specified depressive episodes: Secondary | ICD-10-CM | POA: Insufficient documentation

## 2013-01-05 DIAGNOSIS — F4310 Post-traumatic stress disorder, unspecified: Secondary | ICD-10-CM | POA: Insufficient documentation

## 2013-01-05 DIAGNOSIS — J45909 Unspecified asthma, uncomplicated: Secondary | ICD-10-CM | POA: Insufficient documentation

## 2013-01-05 DIAGNOSIS — F411 Generalized anxiety disorder: Secondary | ICD-10-CM | POA: Insufficient documentation

## 2013-01-05 DIAGNOSIS — F17200 Nicotine dependence, unspecified, uncomplicated: Secondary | ICD-10-CM | POA: Insufficient documentation

## 2013-01-05 DIAGNOSIS — F259 Schizoaffective disorder, unspecified: Secondary | ICD-10-CM | POA: Insufficient documentation

## 2013-01-05 DIAGNOSIS — Z8719 Personal history of other diseases of the digestive system: Secondary | ICD-10-CM | POA: Insufficient documentation

## 2013-01-05 LAB — CBC WITH DIFFERENTIAL/PLATELET
Basophils Relative: 1 % (ref 0–1)
HCT: 41.2 % (ref 39.0–52.0)
Hemoglobin: 13.9 g/dL (ref 13.0–17.0)
Lymphocytes Relative: 34 % (ref 12–46)
MCHC: 33.7 g/dL (ref 30.0–36.0)
Monocytes Absolute: 0.9 10*3/uL (ref 0.1–1.0)
Monocytes Relative: 8 % (ref 3–12)
Neutro Abs: 5.9 10*3/uL (ref 1.7–7.7)

## 2013-01-05 LAB — COMPREHENSIVE METABOLIC PANEL
Alkaline Phosphatase: 76 U/L (ref 39–117)
BUN: 11 mg/dL (ref 6–23)
Chloride: 100 mEq/L (ref 96–112)
GFR calc Af Amer: >90 mL/min (ref >90)
GFR calc non Af Amer: >90 mL/min (ref >90)
Glucose, Bld: 111 mg/dL — ABNORMAL HIGH (ref 70–99)
Potassium: 3.4 mEq/L — ABNORMAL LOW (ref 3.5–5.1)
Total Bilirubin: 0.2 mg/dL — ABNORMAL LOW (ref 0.3–1.2)

## 2013-01-05 LAB — RAPID URINE DRUG SCREEN, HOSP PERFORMED: Opiates: NOT DETECTED

## 2013-01-05 MED ORDER — NICOTINE 21 MG/24HR TD PT24: 21.0000 mg | MEDICATED_PATCH | Freq: Every day | TRANSDERMAL | Status: DC

## 2013-01-05 MED ORDER — ALUM & MAG HYDROXIDE-SIMETH 200-200-20 MG/5ML PO SUSP: 30.0000 mL | ORAL | Status: DC | PRN

## 2013-01-05 MED ORDER — ONDANSETRON HCL 4 MG PO TABS: 4.0000 mg | ORAL_TABLET | Freq: Three times a day (TID) | ORAL | Status: DC | PRN

## 2013-01-05 MED ORDER — ZOLPIDEM TARTRATE 5 MG PO TABS: 5.0000 mg | ORAL_TABLET | Freq: Every evening | ORAL | Status: DC | PRN

## 2013-01-05 MED ORDER — LORAZEPAM 1 MG PO TABS: 1.0000 mg | ORAL_TABLET | Freq: Three times a day (TID) | ORAL | Status: DC | PRN

## 2013-01-05 MED ORDER — IBUPROFEN 200 MG PO TABS: 600.0000 mg | ORAL_TABLET | Freq: Three times a day (TID) | ORAL | Status: DC | PRN

## 2013-01-05 NOTE — ED Notes (Signed)
Belongings returned, bus pass given

## 2013-01-05 NOTE — Progress Notes (Signed)
Attempted to assess patient but patient became agitated and wanted to be left alone. Unable to complete assessment at this time.

## 2013-01-05 NOTE — ED Provider Notes (Signed)
History     CSN: [id number]  Arrival date & time 01/05/13  0003   First MD Initiated Contact with Patient 01/05/13 [phone]      Chief Complaint  Patient presents with  . Suicidal   HPI  History provided by the patient. Patient is a 22 year old male with history of schizoaffective disorder, PTSD, IBS and asthma who presents with complaints of depression and suicidal ideation. Patient states he has had increased depression and feelings of decreased self-worth. This has been worsening over the past 2-3 days. Patient also states he has ran out of some of his medications and is not taking his risperidone. Patient also states he has not been following up with any psychiatrist or therapist recently. He denies any specific social or life event changes. Denies any other aggravating or alleviating factors. He denies any suicide attempts. He has no plan. Denies any HI. Denies any drugs or alcohol use.   Past Medical History  Diagnosis Date  . Schizoaffective disorder   . GERD (gastroesophageal reflux disease)   . Arthritis   . IBS (irritable bowel syndrome)   . Anxiety   . Asthma   . PTSD (post-traumatic stress disorder)     History reviewed. No pertinent past surgical history.  No family history on file.  History  Substance Use Topics  . Smoking status: Current Every Day Smoker -- 2.00 packs/day  . Smokeless tobacco: Never Used  . Alcohol Use: Yes     Comment: Occasionally      Review of Systems  HENT: Positive for sore throat.   Psychiatric/Behavioral: Positive for dysphoric mood.  All other systems reviewed and are negative.    Allergies  Divalproex sodium  Home Medications   Current Outpatient Rx  Name  Route  Sig  Dispense  Refill  . albuterol (PROVENTIL HFA;VENTOLIN HFA) 108 (90 BASE) MCG/ACT inhaler   Inhalation   Inhale 2 puffs into the lungs every 6 (six) hours as needed (wheezing).   1 Inhaler   3   . ARIPiprazole (ABILIFY) 10 MG tablet   Oral   Take 10 mg  by mouth daily.         . carbamazepine (TEGRETOL) 200 MG tablet   Oral   Take 200 mg by mouth 2 (two) times daily.         Marland Kitchen menthol-cetylpyridinium (CEPACOL) 3 MG lozenge   Oral   Take 1 lozenge (3 mg total) by mouth as needed.   100 tablet      . predniSONE (DELTASONE) 20 MG tablet   Oral   Take 2 tablets (40 mg total) by mouth daily.   14 tablet   0   . risperiDONE (RISPERDAL) 0.5 MG tablet   Oral   Take 0.5 mg by mouth 2 (two) times daily.           BP 131/58  Pulse 59  Temp(Src) 98.1 F (36.7 C) (Oral)  Resp 16  SpO2 96%  Physical Exam  Nursing note and vitals reviewed. Constitutional: He is oriented to person, place, and time. He appears well-developed and well-nourished. No distress.  HENT:  Head: Normocephalic.  Mouth/Throat: Oropharynx is clear and moist.  Mild erythema of the pharynx with slight cobblestoning. Tonsils normal in size without exudate. Uvula midline. No soft palate petechia  Cardiovascular: Normal rate and regular rhythm.   Pulmonary/Chest: Effort normal and breath sounds normal. No respiratory distress. He has no wheezes. He has no rales.  Abdominal: Soft. There is no tenderness.  Musculoskeletal: Normal range of motion.  Neurological: He is alert and oriented to person, place, and time.  Skin: Skin is warm.  Psychiatric: His behavior is normal. He exhibits a depressed mood. He expresses suicidal ideation.    ED Course  Procedures  Results for orders placed during the hospital encounter of 01/05/13  CBC WITH DIFFERENTIAL      Result Value Range   WBC 12.0 (*) 4.0 - 10.5 K/uL   RBC 4.59  4.22 - 5.81 MIL/uL   Hemoglobin 13.9  13.0 - 17.0 g/dL   HCT 78.2  95.6 - 21.3 %   MCV 89.8  78.0 - 100.0 fL   MCH 30.3  26.0 - 34.0 pg   MCHC 33.7  30.0 - 36.0 g/dL   RDW 08.6  57.8 - 46.9 %   Platelets 395  150 - 400 K/uL   Neutrophils Relative 50  43 - 77 %   Neutro Abs 5.9  1.7 - 7.7 K/uL   Lymphocytes Relative 34  12 - 46 %   Lymphs  Abs 4.0  0.7 - 4.0 K/uL   Monocytes Relative 8  3 - 12 %   Monocytes Absolute 0.9  0.1 - 1.0 K/uL   Eosinophils Relative 8 (*) 0 - 5 %   Eosinophils Absolute 1.0 (*) 0.0 - 0.7 K/uL   Basophils Relative 1  0 - 1 %   Basophils Absolute 0.2 (*) 0.0 - 0.1 K/uL   WBC Morphology ATYPICAL LYMPHOCYTES    COMPREHENSIVE METABOLIC PANEL      Result Value Range   Sodium 134 (*) 135 - 145 mEq/L   Potassium 3.4 (*) 3.5 - 5.1 mEq/L   Chloride 100  96 - 112 mEq/L   CO2 24  19 - 32 mEq/L   Glucose, Bld 111 (*) 70 - 99 mg/dL   BUN 11  6 - 23 mg/dL   Creatinine, Ser 6.29  0.50 - 1.35 mg/dL   Calcium 9.1  8.4 - 52.8 mg/dL   Total Protein 7.0  6.0 - 8.3 g/dL   Albumin 3.5  3.5 - 5.2 g/dL   AST 17  0 - 37 U/L   ALT 23  0 - 53 U/L   Alkaline Phosphatase 76  39 - 117 U/L   Total Bilirubin 0.2 (*) 0.3 - 1.2 mg/dL   GFR calc non Af Amer >90  >90 mL/min   GFR calc Af Amer >90  >90 mL/min  URINE RAPID DRUG SCREEN (HOSP PERFORMED)      Result Value Range   Opiates NONE DETECTED  NONE DETECTED   Cocaine NONE DETECTED  NONE DETECTED   Benzodiazepines NONE DETECTED  NONE DETECTED   Amphetamines NONE DETECTED  NONE DETECTED   Tetrahydrocannabinol NONE DETECTED  NONE DETECTED   Barbiturates NONE DETECTED  NONE DETECTED  ETHANOL      Result Value Range   Alcohol, Ethyl (B) <11  0 - 11 mg/dL         1. Depression   2. Suicidal ideation       MDM  Patient seen and evaluated. Patient is calm and cooperative. He is soft-spoken and does not make eye contact.  Patient will be moved to psych ED. Holding orders in place. Will obtain tele psych consult.      Angus Seller, PA-C 01/05/13 [phone]

## 2013-01-05 NOTE — ED Notes (Signed)
Pt says "I just feel like I want to be dead". Reports he has been feeling suicidal for 2 days.

## 2013-01-05 NOTE — ED Provider Notes (Signed)
Tele-psychiatrist recommended discharge for patient  John Hutching, MD 01/05/13 [phone]

## 2013-01-06 NOTE — ED Provider Notes (Signed)
Medical screening examination/treatment/procedure(s) were conducted as a shared visit with non-physician practitioner(s) and myself.  I personally evaluated the patient during the encounter.  Depression with suicidal ideation. Consult behavioral health  Donnetta Hutching, MD 01/06/13 2014

## 2013-01-12 NOTE — ED Provider Notes (Signed)
Medical screening examination/treatment/procedure(s) were performed by non-physician practitioner and as supervising physician I was immediately available for consultation/collaboration.   Joya Gaskins, MD 01/12/13 [phone]

## 2013-02-16 ENCOUNTER — Encounter (HOSPITAL_COMMUNITY): Payer: Self-pay | Admitting: Emergency Medicine

## 2013-02-16 ENCOUNTER — Emergency Department (HOSPITAL_COMMUNITY)
Admission: EM | Admit: 2013-02-16 | Discharge: 2013-02-17 | Disposition: A | Discharge status: Other Acute Inpatient Hospital | Payer: Medicaid Other | Source: Home / Self Care | Attending: Emergency Medicine | Admitting: Emergency Medicine

## 2013-02-16 DIAGNOSIS — F411 Generalized anxiety disorder: Secondary | ICD-10-CM | POA: Insufficient documentation

## 2013-02-16 DIAGNOSIS — J45909 Unspecified asthma, uncomplicated: Secondary | ICD-10-CM | POA: Insufficient documentation

## 2013-02-16 DIAGNOSIS — Z8659 Personal history of other mental and behavioral disorders: Secondary | ICD-10-CM | POA: Insufficient documentation

## 2013-02-16 DIAGNOSIS — R45851 Suicidal ideations: Secondary | ICD-10-CM | POA: Insufficient documentation

## 2013-02-16 DIAGNOSIS — R109 Unspecified abdominal pain: Secondary | ICD-10-CM | POA: Insufficient documentation

## 2013-02-16 DIAGNOSIS — F329 Major depressive disorder, single episode, unspecified: Secondary | ICD-10-CM

## 2013-02-16 DIAGNOSIS — F17200 Nicotine dependence, unspecified, uncomplicated: Secondary | ICD-10-CM | POA: Insufficient documentation

## 2013-02-16 DIAGNOSIS — F3289 Other specified depressive episodes: Secondary | ICD-10-CM | POA: Insufficient documentation

## 2013-02-16 DIAGNOSIS — Z8719 Personal history of other diseases of the digestive system: Secondary | ICD-10-CM | POA: Insufficient documentation

## 2013-02-16 DIAGNOSIS — G8929 Other chronic pain: Secondary | ICD-10-CM | POA: Insufficient documentation

## 2013-02-16 DIAGNOSIS — Z8739 Personal history of other diseases of the musculoskeletal system and connective tissue: Secondary | ICD-10-CM | POA: Insufficient documentation

## 2013-02-16 LAB — CBC
HCT: 45.7 % (ref 39.0–52.0)
Platelets: 338 10*3/uL (ref 150–400)
RBC: 5.14 MIL/uL (ref 4.22–5.81)
RDW: 12.8 % (ref 11.5–15.5)
WBC: 13.2 10*3/uL — ABNORMAL HIGH (ref 4.0–10.5)

## 2013-02-16 LAB — ACETAMINOPHEN LEVEL: Acetaminophen (Tylenol), Serum: <15 ug/mL (ref 10–30)

## 2013-02-16 LAB — COMPREHENSIVE METABOLIC PANEL
ALT: 50 U/L (ref 0–53)
AST: 26 U/L (ref 0–37)
Albumin: 3.9 g/dL (ref 3.5–5.2)
Alkaline Phosphatase: 96 U/L (ref 39–117)
Chloride: 104 mEq/L (ref 96–112)
Potassium: 4 mEq/L (ref 3.5–5.1)
Total Bilirubin: 0.5 mg/dL (ref 0.3–1.2)

## 2013-02-16 LAB — RAPID URINE DRUG SCREEN, HOSP PERFORMED
Amphetamines: NOT DETECTED
Barbiturates: NOT DETECTED
Benzodiazepines: NOT DETECTED
Tetrahydrocannabinol: NOT DETECTED

## 2013-02-16 LAB — LIPASE, BLOOD: Lipase: 19 U/L (ref 11–59)
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
## 2013-02-16 NOTE — ED Notes (Addendum)
Pt reports abdominal pain x 1 month and reports blood in toilet after bowel movement that is bright right. "It looks like fresh blood". Pt also reports urinary frequency and burning. Abdominal pain is 5/10 but reports it is worse at times. Pt also says he needs help with Psych meds. Also he reports SI but no plan. Denies HI. Pt in NAD.

## 2013-02-16 NOTE — ED Provider Notes (Signed)
History     CSN: [id number]  Arrival date & time 02/16/13  1501   First MD Initiated Contact with Patient 02/16/13 1600      Chief Complaint  Patient presents with  . Medical Clearance  . Abdominal Pain    (Consider location/radiation/quality/duration/timing/severity/associated sxs/prior treatment) HPI This 22 year old male has chronic daily abdominal pain hematochezia and dysuria for several years as well as chronic anxiety depression and almost daily suicidal ideation for several years, he states that he has been noncompliant with his psychiatric medications for the last couple months and he wants to get back on his psychiatric medications again, he has typical suicidal ideation today without a specific plan, he denies any threats or others denies hallucinations denies fever cough chest pain shortness breath denies any change in his chronic abdominal pain he also has chronic daily blood from his rectal area when he wipes but no melena no diarrhea no dysuria no trauma no rash no treatment prior to arrival. Past Medical History  Diagnosis Date  . Schizoaffective disorder   . Arthritis   . IBS (irritable bowel syndrome)   . Anxiety   . Asthma   . PTSD (post-traumatic stress disorder)   . GERD (gastroesophageal reflux disease)     IBS    History reviewed. No pertinent past surgical history.  Family History  Problem Relation Age of Onset  . Family history unknown: Yes    History  Substance Use Topics  . Smoking status: Current Every Day Smoker -- 2.00 packs/day    Types: Cigarettes  . Smokeless tobacco: Never Used  . Alcohol Use: Yes     Comment: Occasionally      Review of Systems 10 Systems reviewed and are negative for acute change except as noted in the HPI. Allergies  Buspirone and Divalproex sodium  Home Medications  No current outpatient prescriptions on file.  BP 100/63  Pulse 59  Temp(Src) 98.4 F (36.9 C) (Oral)  Resp 24  SpO2 97%  Physical Exam   Nursing note and vitals reviewed. Constitutional:  Awake, alert, nontoxic appearance.  HENT:  Head: Atraumatic.  Eyes: Right eye exhibits no discharge. Left eye exhibits no discharge.  Neck: Neck supple.  Cardiovascular: Normal rate and regular rhythm.   No murmur heard. Pulmonary/Chest: Effort normal and breath sounds normal. No respiratory distress. He has no wheezes. He has no rales. He exhibits no tenderness.  Abdominal: Soft. Bowel sounds are normal. He exhibits no distension and no mass. There is tenderness. There is no rebound and no guarding.  Minimal epigastric and minimal diffuse lower abdominal tenderness with no tenderness quadrant or left upper quadrant is no percussion tenderness no peritoneal signs his testicles are nontender he has no palpable hernias his rectal examination is nontender with light brown stool with no gross blood noted and no melena  Musculoskeletal: He exhibits no tenderness.  Baseline ROM, no obvious new focal weakness.  Neurological: He is alert.  Mental status and motor strength appears baseline for patient and situation.  Skin: No rash noted.  Psychiatric:  Anxious and depressed with some suicidal ideation without specific plan he denies any homicidal ideation or hallucinations    ED Course  Procedures (including critical care time) Patient understand and agree with initial ED impression and plan with expectations set for ED visit. Labs Reviewed  CBC - Abnormal; Notable for the following:    WBC 13.2 (*)    All other components within normal limits  COMPREHENSIVE METABOLIC PANEL - Abnormal;  Notable for the following:    Glucose, Bld 116 (*)    All other components within normal limits  SALICYLATE LEVEL - Abnormal; Notable for the following:    Salicylate Lvl <2.0 (*)    All other components within normal limits  ACETAMINOPHEN LEVEL  ETHANOL  URINE RAPID DRUG SCREEN (HOSP PERFORMED)  LIPASE, BLOOD   No results found.   1. Depression    2. Chronic abdominal pain    MDM  Dispo pending.        Hurman Horn, MD 02/17/13 2149

## 2013-02-17 ENCOUNTER — Inpatient Hospital Stay (HOSPITAL_COMMUNITY)
Admission: EM | Admit: 2013-02-17 | Discharge: 2013-02-22 | DRG: 885 | Disposition: A | Discharge status: Home / Self Care | Payer: Medicaid Other | Source: Intra-hospital | Attending: Psychiatry | Admitting: Psychiatry

## 2013-02-17 ENCOUNTER — Encounter (HOSPITAL_COMMUNITY): Payer: Self-pay | Admitting: *Deleted

## 2013-02-17 DIAGNOSIS — F259 Schizoaffective disorder, unspecified: Principal | ICD-10-CM

## 2013-02-17 DIAGNOSIS — Z79899 Other long term (current) drug therapy: Secondary | ICD-10-CM

## 2013-02-17 DIAGNOSIS — J45909 Unspecified asthma, uncomplicated: Secondary | ICD-10-CM | POA: Diagnosis present

## 2013-02-17 DIAGNOSIS — F909 Attention-deficit hyperactivity disorder, unspecified type: Secondary | ICD-10-CM | POA: Diagnosis present

## 2013-02-17 DIAGNOSIS — F4310 Post-traumatic stress disorder, unspecified: Secondary | ICD-10-CM | POA: Diagnosis present

## 2013-02-17 DIAGNOSIS — R45851 Suicidal ideations: Secondary | ICD-10-CM

## 2013-02-17 DIAGNOSIS — F819 Developmental disorder of scholastic skills, unspecified: Secondary | ICD-10-CM

## 2013-02-17 DIAGNOSIS — F411 Generalized anxiety disorder: Secondary | ICD-10-CM | POA: Diagnosis present

## 2013-02-17 DIAGNOSIS — F902 Attention-deficit hyperactivity disorder, combined type: Secondary | ICD-10-CM

## 2013-02-17 MED ORDER — ALUM & MAG HYDROXIDE-SIMETH 200-200-20 MG/5ML PO SUSP
30.0000 mL | ORAL | Status: DC | PRN
  Administered 2013-02-17 – 2013-02-22 (×5): 30 mL via ORAL

## 2013-02-17 MED ORDER — LORAZEPAM 0.5 MG PO TABS: 0.5000 mg | ORAL_TABLET | Freq: Three times a day (TID) | ORAL | Status: DC | PRN

## 2013-02-17 MED ORDER — ACETAMINOPHEN 325 MG PO TABS: 650.0000 mg | ORAL_TABLET | Freq: Four times a day (QID) | ORAL | Status: DC | PRN

## 2013-02-17 MED ORDER — MAGNESIUM HYDROXIDE 400 MG/5ML PO SUSP: 30.0000 mL | Freq: Every day | ORAL | Status: DC | PRN

## 2013-02-17 MED ORDER — CHLORDIAZEPOXIDE HCL 25 MG PO CAPS
25.0000 mg | ORAL_CAPSULE | Freq: Four times a day (QID) | ORAL | Status: DC | PRN
  Administered 2013-02-17 – 2013-02-18 (×2): 25 mg via ORAL
  Filled 2013-02-17 (×2): qty 1

## 2013-02-17 MED ORDER — RISPERIDONE 0.5 MG PO TABS: 0.2500 mg | ORAL_TABLET | Freq: Every day | ORAL | Status: DC

## 2013-02-17 MED ORDER — TRAZODONE HCL 50 MG PO TABS
50.0000 mg | ORAL_TABLET | Freq: Every evening | ORAL | Status: DC | PRN
  Administered 2013-02-17 – 2013-02-19 (×3): 50 mg via ORAL
  Filled 2013-02-17: qty 1

## 2013-02-17 NOTE — [facility] Group Notes (Signed)
[facility] LCSW Group Therapy  Emotional Regulation 1:15 - 2: 30 PM        02/17/2013  1:16 PM   Type of Therapy:  Group Therapy  Participation Level:  Did not attend group.  Wynn Banker 02/17/2013 1:16 PM

## 2013-02-17 NOTE — Tx Team (Signed)
Initial Interdisciplinary Treatment Plan  PATIENT STRENGTHS: (choose at least two) Communication skills General fund of knowledge  PATIENT STRESSORS: Marital or family conflict   PROBLEM LIST: Problem List/Patient Goals Date to be addressed Date deferred Reason deferred Estimated date of resolution  Help with medication 02/16/13     depression 02/16/13                                                DISCHARGE CRITERIA:  Ability to meet basic life and health needs Improved stabilization in mood, thinking, and/or behavior Motivation to continue treatment in a less acute level of care Need for constant or close observation no longer present Reduction of life-threatening or endangering symptoms to within safe limits Verbal commitment to aftercare and medication compliance  PRELIMINARY DISCHARGE PLAN: Outpatient therapy  PATIENT/FAMIILY INVOLVEMENT: This treatment plan has been presented to and reviewed with the patient, John Gibson, and/or family member,   The patient and family have been given the opportunity to ask questions and make suggestions.  Beatrix Shipper 02/17/2013, 12:13 PM

## 2013-02-17 NOTE — Consult Note (Addendum)
Reason for Consult Eval for Ip psychiatric Tx Referring Physician: Fonnie Jarvis MD  John Gibson is an 22 y.o. male.  HPI: Pt is a 22 y/o Wm with hx of mood d/o with c/o of exacerbated sx x 2 - 3 weeks duration. Sx include decreased sleep, irritability, mood swings, hopelessness, helplessness and racing thoughts. Pt endorses passive SI but denies SA/HI or AVH. Pt sees Dr Dawayne Patricia MD for OP psychiatry, but has been non compliant with Rx psychotropics x 2 weeks. Pt cannot contract for safety at this time.  Past Medical History  Diagnosis Date  . Schizoaffective disorder   . GERD (gastroesophageal reflux disease)   . Arthritis   . IBS (irritable bowel syndrome)   . Anxiety   . Asthma   . PTSD (post-traumatic stress disorder)     History reviewed. No pertinent past surgical history.  No family history on file.  Social History:  reports that he has been smoking.  He has never used smokeless tobacco. He reports that  drinks alcohol. He reports that he uses illicit drugs (Marijuana).  Allergies:  Allergies  Allergen Reactions  . Divalproex Sodium Hives, Itching and Rash    Medications: I have reviewed the patient's current medications.  Results for orders placed during the hospital encounter of 02/16/13 (from the past 48 hour(s))  ACETAMINOPHEN LEVEL     Status: None   Collection Time    02/16/13  3:40 PM      Result Value Range   Acetaminophen (Tylenol), Serum <15.0  10 - 30 ug/mL   Comment:            THERAPEUTIC CONCENTRATIONS VARY     SIGNIFICANTLY. A RANGE OF 10-30     ug/mL MAY BE AN EFFECTIVE     CONCENTRATION FOR MANY PATIENTS.     HOWEVER, SOME ARE BEST TREATED     AT CONCENTRATIONS OUTSIDE THIS     RANGE.     ACETAMINOPHEN CONCENTRATIONS     >150 ug/mL AT 4 HOURS AFTER     INGESTION AND >50 ug/mL AT 12     HOURS AFTER INGESTION ARE     OFTEN ASSOCIATED WITH TOXIC     REACTIONS.  CBC     Status: Abnormal   Collection Time    02/16/13  3:40 PM      Result Value Range   WBC  13.2 (*) 4.0 - 10.5 K/uL   RBC 5.14  4.22 - 5.81 MIL/uL   Hemoglobin 15.8  13.0 - 17.0 g/dL   HCT 16.1  09.6 - 04.5 %   MCV 88.9  78.0 - 100.0 fL   MCH 30.7  26.0 - 34.0 pg   MCHC 34.6  30.0 - 36.0 g/dL   RDW 40.9  81.1 - 91.4 %   Platelets 338  150 - 400 K/uL  COMPREHENSIVE METABOLIC PANEL     Status: Abnormal   Collection Time    02/16/13  3:40 PM      Result Value Range   Sodium 139  135 - 145 mEq/L   Potassium 4.0  3.5 - 5.1 mEq/L   Chloride 104  96 - 112 mEq/L   CO2 25  19 - 32 mEq/L   Glucose, Bld 116 (*) 70 - 99 mg/dL   BUN 8  6 - 23 mg/dL   Creatinine, Ser 7.82  0.50 - 1.35 mg/dL   Calcium 9.4  8.4 - 95.6 mg/dL   Total Protein 8.1  6.0 - 8.3 g/dL  Albumin 3.9  3.5 - 5.2 g/dL   AST 26  0 - 37 U/L   ALT 50  0 - 53 U/L   Alkaline Phosphatase 96  39 - 117 U/L   Total Bilirubin 0.5  0.3 - 1.2 mg/dL   GFR calc non Af Amer >90  >90 mL/min   GFR calc Af Amer >90  >90 mL/min   Comment:            The eGFR has been calculated     using the CKD EPI equation.     This calculation has not been     validated in all clinical     situations.     eGFR's persistently     <90 mL/min signify     possible Chronic Kidney Disease.  ETHANOL     Status: None   Collection Time    02/16/13  3:40 PM      Result Value Range   Alcohol, Ethyl (B) <11  0 - 11 mg/dL   Comment:            LOWEST DETECTABLE LIMIT FOR     SERUM ALCOHOL IS 11 mg/dL     FOR MEDICAL PURPOSES ONLY  SALICYLATE LEVEL     Status: Abnormal   Collection Time    02/16/13  3:40 PM      Result Value Range   Salicylate Lvl <2.0 (*) 2.8 - 20.0 mg/dL  LIPASE, BLOOD     Status: None   Collection Time    02/16/13  3:40 PM      Result Value Range   Lipase 19  11 - 59 U/L  URINE RAPID DRUG SCREEN (HOSP PERFORMED)     Status: None   Collection Time    02/16/13  5:02 PM      Result Value Range   Opiates NONE DETECTED  NONE DETECTED   Cocaine NONE DETECTED  NONE DETECTED   Benzodiazepines NONE DETECTED  NONE DETECTED    Amphetamines NONE DETECTED  NONE DETECTED   Tetrahydrocannabinol NONE DETECTED  NONE DETECTED   Barbiturates NONE DETECTED  NONE DETECTED   Comment:            DRUG SCREEN FOR MEDICAL PURPOSES     ONLY.  IF CONFIRMATION IS NEEDED     FOR ANY PURPOSE, NOTIFY LAB     WITHIN 5 DAYS.                LOWEST DETECTABLE LIMITS     FOR URINE DRUG SCREEN     Drug Class       Cutoff (ng/mL)     Amphetamine      1000     Barbiturate      200     Benzodiazepine   200     Tricyclics       300     Opiates          300     Cocaine          300     THC              50    No results found.  Review of Systems  Constitutional: Negative.   HENT: Negative.   Eyes: Negative.   Respiratory: Negative.   Cardiovascular: Negative.   Gastrointestinal: Positive for nausea and abdominal pain. Negative for blood in stool and melena.  Genitourinary: Negative.   Musculoskeletal: Negative.   Skin: Negative.  Neurological: Negative for dizziness, sensory change, focal weakness, seizures and loss of consciousness.  Endo/Heme/Allergies: Negative.   Psychiatric/Behavioral: Positive for depression, suicidal ideas and memory loss. Negative for hallucinations and substance abuse. The patient is nervous/anxious and has insomnia.    Blood pressure 117/66, pulse 78, temperature 98.2 F (36.8 C), temperature source Oral, resp. rate 18, SpO2 98.00%. Physical Exam  Vitals reviewed. Constitutional: He is oriented to person, place, and time. He appears well-developed and well-nourished.  HENT:  Head: Normocephalic.  Eyes: Pupils are equal, round, and reactive to light.  Neck: No thyromegaly present.  Cardiovascular: Normal rate.   Respiratory: Breath sounds normal.  GI: Soft. He exhibits no distension. There is no tenderness. There is no rebound and no guarding.  Genitourinary:  Exam deferred  Musculoskeletal: Normal range of motion.  Neurological: He is alert and oriented to person, place, and time. No  cranial nerve deficit.  Skin: Skin is warm and dry.  Psychiatric:  Poor eye contact with sad affect,  Disheveled appearance with limited insight.    Assessment/Plan: 1) admit to [facility] for crises mgmt,  safety and stabilization 2) Mgmt of co -morbid conditions as appropriate 3) Intensive IP psycho therapy 4) restart psychotropic Rx to reduce current sx to baseline and decrease the chance for recurrent readmissions.  SIMON,SPENCER E 02/17/2013, 12:08 AM   Follow up consult: face to face interview and consult with Dr. Lolly Mustache Recommendations:  In agreement with previous assessment/plan for inpatient treatment in addition start patient on Risperdal 0.5 mg QHS and Ativan 0.25 TID PRN anxiety John Gibson B. Elwood Bazinet FNP-BC Family Nurse Practitioner, Board Certified
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
## 2013-02-17 NOTE — Progress Notes (Signed)
Adult Psychoeducational Group Note  Date:  02/17/2013 Time:  8:00PM Group Topic/Focus:  Wrap-Up Group:   The focus of this group is to help patients review their daily goal of treatment and discuss progress on daily workbooks.  Participation Level:  Active  Participation Quality:  Appropriate and Attentive  Affect:  Appropriate  Cognitive:  Alert and Appropriate  Insight: Appropriate  Engagement in Group:  Engaged  Modes of Intervention:  Discussion  Additional Comments:  Pt. Was attentive and appropriate during tonight's group discussion. Pt stated that he had a up and down day. Pt shared how he don't like going to the cafe with everyone. Pt don't understand why he is here. Pt stated he will talk to the doctor tomorrow to see what is going on with him and see how long his stay will be.  Bing Plume D 02/17/2013, 9:11 PM

## 2013-02-17 NOTE — Progress Notes (Signed)
Recreation Therapy Notes  Date: 06.11.2014  Time: 3:00pm  Location: [address] Day Room  Group Topic/Focus: Problem Solving, Communication, Team Work  Participation Level:  Did not attend.   Marykay Lex Sheyli Horwitz, LRT/CTRS  Jearl Klinefelter 02/17/2013 4:22 PM

## 2013-02-17 NOTE — Progress Notes (Signed)
Patient ID: John Gibson, male   DOB: 05-19-1991, 22 y.o.   MRN: [id number] D: Pt. C/o because of his meds "I was on Xanax, need to get back on my medicine, which I don't think I'll get" " I was off it for 2 months." A: Writer introduced self to client, and told him she would get orders from PA for meds for anxiety. Staff will monitor q84min safety. Writer encouraged group. R: Pt. Is safe on the unit. Pt. Attended group.

## 2013-02-17 NOTE — ED Notes (Signed)
Report called to Jan RN at [facility]. Will be transporting shortly.

## 2013-02-17 NOTE — ED Provider Notes (Signed)
Patient has been accepted at [facility] by Dr. Elsie Saas.   Dione Booze, MD 02/17/13 [phone]

## 2013-02-18 DIAGNOSIS — F3160 Bipolar disorder, current episode mixed, unspecified: Secondary | ICD-10-CM

## 2013-02-18 DIAGNOSIS — F1910 Other psychoactive substance abuse, uncomplicated: Secondary | ICD-10-CM

## 2013-02-18 MED ORDER — CLONIDINE HCL ER 0.1 MG PO TB12
0.1000 mg | ORAL_TABLET | ORAL | Status: DC
  Administered 2013-02-18 – 2013-02-22 (×8): 0.1 mg via ORAL
  Filled 2013-02-18 (×3): qty 1
  Filled 2013-02-18: qty 4
  Filled 2013-02-18 (×2): qty 1
  Filled 2013-02-18: qty 4
  Filled 2013-02-18 (×5): qty 1

## 2013-02-18 MED ORDER — DULOXETINE HCL 20 MG PO CPEP
20.0000 mg | ORAL_CAPSULE | Freq: Every day | ORAL | Status: DC
  Administered 2013-02-18 – 2013-02-19 (×2): 20 mg via ORAL
  Filled 2013-02-18 (×3): qty 1

## 2013-02-18 MED ORDER — PRAMOXINE-ZINC OXIDE IN MO 1-12.5 % RE OINT
TOPICAL_OINTMENT | Freq: Three times a day (TID) | RECTAL | Status: DC | PRN
  Administered 2013-02-19: 01:00:00 via RECTAL
  Filled 2013-02-18: qty 28.3

## 2013-02-18 NOTE — Progress Notes (Signed)
Patient ID: John Gibson, male   DOB: Jul 27, 1991, 21 y.o.   MRN: [id number]  D: Patient rated depression as 3 and anxiety as 3 on a 0-10 scale. Pt c/o irritated anus due to increase  Bowel movement secondary to IBS. Pt is visible on unit interacting with peers and playing cards in the dayroom. Pt denies SI/HI/AVH and pain. Pt attended evening karaoke group and was engaged with peers. Pt denies any needs or concerns.  Cooperative with assessment. No acute distressed noted at this time.   A: Met with pt 1:1. Medication administered as prescribed. Writer encouraged pt to discuss feelings. Pt encouraged to come to staff with any question or concerns. 15 minutes checks for safety.  R: Patient remains safe. Continue current POC.

## 2013-02-18 NOTE — Progress Notes (Addendum)
Patient upset about not having any scheduled medications.  Nurse offered librium 5 mg to patient for anxiety but patient declined.  Patient walked down hallway and threw his blue folder in floor, went to his room, then walked to nursing station angry.  Nurse spoke to patient and he said "I am not F_______ talking to you bitch."   [facility] informed of patient's behavior.  After morning group and snacks, patient walked by nurse and stated "I'm not talking to my nurse today."  D:  Patient's self inventory sheet, patient has fair sleep, good appetite, low energy level, poor attention span.  Rated depression #7, hopelessness #9.  SI of/on, contracts for safety.  Has felt lightheaded, dizziness, blurred vision.  Pain goal #3, worst pain #8.  "Want to get involved with [facility] or [facility] stay on meds communicate.  I need tobe discharged with a nerve pill and my help is mine. I grow _____ to some drugs quicker than others."   No discharge plans.  Not sure if he can afford meds or not. A:  No scheduled medications this morning.  Patient refused Librium 25 mg prn for anxiety.  Emotional support and encouragement given patient. R: SI off/on, contracts for safety.  Denied HI.   Denied A/V hallucinations.  Will continue to monitor patient for safety with 15 minute checks.  Safety maintained.  Patient told nurse this morning that he was taking xanax 0.25 mg twice daily scheduled.  Last prescription filled 2 months ago by Dr. Alfredia Ferguson[facility].  Stated he is use to taking nerve pills constantly.  Patient wants to leave facility if he is not going to get any treatment.
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
## 2013-02-18 NOTE — [facility] Group Notes (Signed)
[facility] LCSW Group Therapy  [facility]  1:15 - 3: 30          02/18/2013  1:14 PM    Type of Therapy:  Group Therapy  Participation Level:  Appropriate  Participation Quality:  Appropriate  Affect:  Appropriate  Cognitive:  Attentive Appropriate  Insight:  Engaged  Engagement in Therapy:  Engaged  Modes of Intervention:  Discussion Exploration Problem-Solving Supportive  Summary of Progress/Problems: Patient listened attentively to speaker from [facility] and nodded head on several comments made by speaker.  Patient unable to participate in services due to being from [facility].   Wynn Banker 02/18/2013  1:14 PM

## 2013-02-18 NOTE — [facility] Group Notes (Signed)
[facility] LCSW Aftercare Discharge Planning Group Note   02/18/2013 1:12 PM  Participation Quality:  Approprite  Mood/Affect:  Anxious, Appropriate and Irritable  Depression Rating:  5  Anxiety Rating:  9  Thoughts of Suicide:  No Will you contract for safety?   NA  Current AVH:  No  Plan for Discharge/Comments:  Patient reports being irritable and upset that he is not getting requested medications.  He denies SI/HI.  Patient shared he will follow up in [facility] at discharge.  Transportation Means: Patient uses public transportation.   Supports:  Patient has a good support system.   Cathlin Buchan, Joesph July

## 2013-02-19 DIAGNOSIS — R6250 Unspecified lack of expected normal physiological development in childhood: Secondary | ICD-10-CM

## 2013-02-19 DIAGNOSIS — F259 Schizoaffective disorder, unspecified: Secondary | ICD-10-CM

## 2013-02-19 DIAGNOSIS — F849 Pervasive developmental disorder, unspecified: Secondary | ICD-10-CM

## 2013-02-19 DIAGNOSIS — F909 Attention-deficit hyperactivity disorder, unspecified type: Secondary | ICD-10-CM

## 2013-02-19 MED ORDER — DULOXETINE HCL 30 MG PO CPEP
30.0000 mg | ORAL_CAPSULE | Freq: Every day | ORAL | Status: DC
  Administered 2013-02-20 – 2013-02-22 (×3): 30 mg via ORAL
  Filled 2013-02-19: qty 1
  Filled 2013-02-19: qty 2
  Filled 2013-02-19 (×3): qty 1

## 2013-02-19 MED ORDER — TRAZODONE HCL 50 MG PO TABS
75.0000 mg | ORAL_TABLET | Freq: Every day | ORAL | Status: DC
  Administered 2013-02-19 – 2013-02-20 (×2): 75 mg via ORAL
  Filled 2013-02-19 (×4): qty 1

## 2013-02-19 NOTE — Progress Notes (Signed)
D:  Patient's self inventory sheet, patient sleeps well, poor appetite, low energy level, poor attention span.  Rated depression #2, denied hopelessness and anxiety.  Denied withdrawals.  Denied SI.  Has back problems.  Worst pain #4, zero pain goal.  After discharge, plans to go to mental health outpatient, take meds.  Needs money to purchase meds after discharge.  Plans to live with uncle after discharge. A:  Medications administered per MD orders.  Emotional support and encouragement given. R:  Denied SI and HI.  Denied A/V hallucinations.  Denied pain.   Will continue to monitor for safety with 15 minute checks.  Safety maintained. Patient went to morning group and left, angry at SW for wanting him in group.   Patient has been in bed all day.  No attempt to go to groups/dining room.

## 2013-02-19 NOTE — [facility] Group Notes (Signed)
[facility] LCSW Aftercare Discharge Planning Group Note   02/19/2013 10:29 AM  Participation Quality:  Did not attend group.   John Gibson, John Gibson

## 2013-02-19 NOTE — Progress Notes (Signed)
Adult Psychoeducational Group Note  Date:  02/19/2013 Time:  1:23 PM  Group Topic/Focus:  Relapse Prevention Planning:   The focus of this group is to define relapse and discuss the need for planning to combat relapse.  Participation Level:  Did Not Attend  Participation Quality:  Did not Attend  Affect:  Did not Attend  Cognitive:  Did not Attend  Insight: None  Engagement in Group:  Did not Attend  Modes of Intervention:  Did not Attend  Additional Comments:    Cathlean Cower 02/19/2013, 1:23 PM
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
## 2013-02-19 NOTE — Progress Notes (Signed)
Adult Psychoeducational Group Note  Date:  02/19/2013 Time:  12:36 PM  Group Topic/Focus:  Goals Group:   The focus of this group is to help patients establish daily goals to achieve during treatment and discuss how the patient can incorporate goal setting into their daily lives to aide in recovery.  Participation Level:  Did Not Attend  Participation Quality:  Did not Attend  Affect:  Did not Attend  Cognitive:  Did not Attend  Insight: None  Engagement in Group:  Did not Attend  Modes of Intervention:  Did not Attend  Additional Comments:  Patient stayed in room and slept.  Cathlean Cower 02/19/2013, 12:36 PM

## 2013-02-19 NOTE — [facility] Group Notes (Addendum)
[facility] LCSW Group Therapy  .tdFeelings Around Relapse 1:15 -2:30        02/19/2013 2:45 PM   Type of Therapy:  Group Therapy  Participation Level:  Did not attend group.  Wynn Banker 02/19/2013 2:45 PM

## 2013-02-19 NOTE — Progress Notes (Signed)
[facility] Group Notes:  (Nursing/MHT/Case Management/Adjunct)  Date:  02/19/2013  Time:  10:51 PM  Type of Therapy:  Group Therapy  Participation Level:  Active  Participation Quality:  Appropriate  Affect:  Appropriate  Cognitive:  Disorganized  Insight:  Appropriate  Engagement in Group:  Developing/Improving  Modes of Intervention:  Activity, Socialization and Support  Summary of Progress/Problems: Pt. Was engaged in activity.  Pt. Stated his early waring sign for relapse prevention was attitude change.  Sondra Come 02/19/2013, 10:51 PM
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
## 2013-02-20 DIAGNOSIS — F259 Schizoaffective disorder, unspecified: Principal | ICD-10-CM

## 2013-02-20 DIAGNOSIS — F848 Other pervasive developmental disorders: Secondary | ICD-10-CM

## 2013-02-20 DIAGNOSIS — R6250 Unspecified lack of expected normal physiological development in childhood: Secondary | ICD-10-CM

## 2013-02-20 DIAGNOSIS — F909 Attention-deficit hyperactivity disorder, unspecified type: Secondary | ICD-10-CM

## 2013-02-20 MED ORDER — CHLORDIAZEPOXIDE HCL 25 MG PO CAPS: 25.0000 mg | ORAL_CAPSULE | Freq: Two times a day (BID) | ORAL | Status: DC | PRN

## 2013-02-20 MED ORDER — RISPERIDONE 0.5 MG PO TABS
0.5000 mg | ORAL_TABLET | Freq: Every day | ORAL | Status: DC
  Administered 2013-02-20: 0.5 mg via ORAL
  Filled 2013-02-20 (×4): qty 1

## 2013-02-20 NOTE — Progress Notes (Signed)
The focus of this group is to help patients review their daily goal of treatment and discuss progress on daily workbooks. Pt attended the evening group session and responded to all discussion prompts from the Writer. Pt began the group by announcing "I have a dirty little secret - I'm gay and I have crushes on several people on the hallway, including you. You're sexy and I want to make a pornographic film with you." The Writer immediately informed the Pt that he was crossing a line and being very inappropriate. Pt required similar redirection for the remainder of group. Pt reported having a wonderful day but pointed to no specific reasons as to why. Pt smiled throughout group and frequently laughed, interrupting his peers to do so.

## 2013-02-20 NOTE — Progress Notes (Signed)
D) Pt is childlike in his interactions at times with his peers but stated he feels accepted here. Feels that his sexuality makes others not accept him. Rates his depression and hopelessness both as a 1 and denies SI and HI. Gets along well with his peers. Limited in his understanding A) Frequent contact with Pt throughout the day. Pt will come up to talk for a few minutes and then will leave. Therapeutic humor used. R) Pt is limited in his understanding and processing. Is pleasant.

## 2013-02-20 NOTE — Progress Notes (Signed)
[facility] MD Progress Note  02/20/2013 2:45 PM John Gibson  MRN:  [id number] Subjective:  [name] is a 22 y/o male with a past psychiatric history significant for. The patient reports that he has not been aggressive.  The patient continues to have problems with aggression.  He states that he has been requiring librium for anxiety at least once a day.  The patient reports that trazodone at 100 was too strong, 50 mg didn't work, and 75 mg worked and didn't leave him drowsy.  The patient is interested in outpatient therapy on discharge.  Diagnosis:  Axis I: ADHD, combined type, Asperger's Disorder, Schizoaffective Disorder and Learning disorder   ADL's:  Intact  Sleep: Fair  Appetite:  Good  Suicidal Ideation:  Plan:  Patient denies. Intent:  Patient denies. Means:  Patient denies. Homicidal Ideation:  Plan:  Patient denies. Intent:  Patient denies. Means:  Patient denies. AEB (as evidenced by): Behavior on the unit  Psychiatric Specialty Exam: Review of Systems  Constitutional: Negative for fever, chills, weight loss and malaise/fatigue.  Respiratory: Negative for hemoptysis, sputum production, shortness of breath and wheezing.   Cardiovascular: Negative for chest pain, palpitations and leg swelling.  Gastrointestinal: Positive for diarrhea and constipation. Negative for heartburn, nausea, vomiting and abdominal pain.  Neurological: Negative for dizziness, tingling, tremors, sensory change, speech change and focal weakness.    Blood pressure 100/60, pulse 96, temperature 98 F (36.7 C), temperature source Oral, resp. rate 16, height 5' 5.75" (1.67 m), weight 102.967 kg (227 lb).Body mass index is 36.92 kg/(m^2).  General Appearance: Casual and Fairly Groomed  Patent attorney::  Good  Speech:  Pressured  Volume:  Increased  Mood:  Anxious  Affect:  Congruent and Full Range  Thought Process:  Coherent, Linear and Logical  Orientation:  Full (Time, Place, and Person)  Thought  Content:  WDL  Suicidal Thoughts:  No  Homicidal Thoughts:  No  Memory:  Immediate;   Good Recent;   Good Remote;   Good  Judgement:  Impaired  Insight:  Shallow  Psychomotor Activity:  Normal  Concentration:  Fair  Recall:  Fair  Akathisia:  Negative  Handed:  Right  AIMS (if indicated):   As note in chart.  Assets:  Communication Skills Desire for Improvement Social Support  Sleep:  Number of Hours: 4   Current Medications: Current Facility-Administered Medications  Medication Dose Route Frequency Provider Last Rate Last Dose  . acetaminophen (TYLENOL) tablet 650 mg  650 mg Oral Q6H PRN Fransisca Kaufmann, NP      . alum & mag hydroxide-simeth (MAALOX/MYLANTA) 200-200-20 MG/5ML suspension 30 mL  30 mL Oral Q4H PRN Fransisca Kaufmann, NP   30 mL at 02/20/13 1354  . chlordiazePOXIDE (LIBRIUM) capsule 25 mg  25 mg Oral QID PRN Kerry Hough, PA-C   25 mg at 02/18/13 1522  . cloNIDine HCl (KAPVAY) ER tablet 0.1 mg  0.1 mg Oral BH-qamhs Nehemiah Settle, MD   0.1 mg at 02/20/13 0811  . DULoxetine (CYMBALTA) DR capsule 30 mg  30 mg Oral Daily Nehemiah Settle, MD   30 mg at 02/20/13 1478  . magnesium hydroxide (MILK OF MAGNESIA) suspension 30 mL  30 mL Oral Daily PRN Fransisca Kaufmann, NP      . pramoxine-mineral oil-zinc (TUCKS) rectal ointment   Rectal TID PRN Kerry Hough, PA-C      . traZODone (DESYREL) tablet 75 mg  75 mg Oral QHS Nehemiah Settle, MD  75 mg at 02/19/13 2151   Facility-Administered Medications Ordered in Other Encounters  Medication Dose Route Frequency Provider Last Rate Last Dose  . risperiDONE (RISPERDAL) tablet 0.25 mg  0.25 mg Oral QHS Shuvon Rankin, NP        Lab Results: No results found for this or any previous visit (from the past 48 hour(s)).  Physical Findings: AIMS: Facial and Oral Movements Muscles of Facial Expression: None, normal Lips and Perioral Area: None, normal Jaw: None, normal Tongue: None, normal,Extremity  Movements Upper (arms, wrists, hands, fingers): None, normal Lower (legs, knees, ankles, toes): None, normal, Trunk Movements Neck, shoulders, hips: None, normal, Overall Severity Severity of abnormal movements (highest score from questions above): None, normal Incapacitation due to abnormal movements: None, normal Patient's awareness of abnormal movements (rate only patient's report): No Awareness, Dental Status Current problems with teeth and/or dentures?: No Does patient usually wear dentures?: No  CIWA:  CIWA-Ar Total: 1 COWS:  COWS Total Score: 2  Treatment Plan Summary: Daily contact with patient to assess and evaluate symptoms and progress in treatment Medication management  Plan:  1. Increase Cymbalta 30 mg QD  2. Change Trazodone 50 mg 1 1/2 tab at Qhs / insomnia  3. Risperidone 0.5 mg QHS. 4. Will decrease librium to 25 mg BID PRN. 5. Continue other prescription medications  6.Encourage in patient hospital program including milieu and group therapy  7. LOS 3-5 days  Medical Decision Making  Problem Points: Established problem, worsening (2) and Review of psycho-social stressors (1)  Data Points: Review or order clinical lab tests (1)  Review of medication regiment & side effects (2)  Review of new medications or change in dosage (2)   I certify that inpatient services furnished can reasonably be expected to improve the patient's condition.   John Gibson 02/20/2013, 2:45 PM

## 2013-02-20 NOTE — Progress Notes (Signed)
Psychoeducational Group Note  Date: 02/20/2013 Time:  1015  Group Topic/Focus:  Identifying Needs:   The focus of this group is to help patients identify their personal needs that have been historically problematic and identify healthy behaviors to address their needs.  Participation Level:  Active  Participation Quality:  Appropriate  Affect:  Appropriate  Cognitive:  Appropriate  Insight:  Improving  Engagement in Group:  Improving  Additional Comments:  Pt participated in group.   Brannon Levene A  

## 2013-02-20 NOTE — Progress Notes (Signed)
D: Patient resting in bed. Respirations even and unlabored. No apparent distress noted.  A: Monitoring pt Q15 min for safety  R: Patient remains safe on the unit. Will continue to monitor.

## 2013-02-20 NOTE — Progress Notes (Signed)
.  Psychoeducational Group Note    Date: 02/20/2013 Time:  0915    Goal Setting Purpose of Group: To be able to set a goal that is measurable and that can be accomplished in one day Participation Level:  Active  Participation Quality:  Appropriate  Affect:  Appropriate  Cognitive:  Oriented  Insight:  Improving  Engagement in Group:  Engaged  Additional Comments:    Dreyson Mishkin A 

## 2013-02-20 NOTE — [facility] Group Notes (Signed)
[facility] LCSW Group Therapy  Stages of Change 02/20/2013  1:39 PM    Type of Therapy:  Group Therapy  Participation Level:  Active  Participation Quality:  Appropriate  Affect:  Appropriate  Cognitive:  Appropriate  Insight:  Developing/Improving and Engaged  Engagement in Therapy:  Developing/Improving and Engaged  Modes of Intervention:  Discussion, Education, Exploration, Problem-Solving, Rapport Building, Support  Summary of Progress/Problems: The topic for today was the stages of changes.  Patient were asked to identify changes they need to make and their motivation and commitment to making changes in their lives.  Patient shared he needs to move away from family who do not accept that he is gay.  He shared he plans to relocate to [place] and asked if there is an agency that will assist with a bus ticket.  Patient was advised there is not an agency that will buy him a ticket to [place].  He was encouraged no to move too quickly but to take time to save money and then relocate.    Wynn Banker 02/20/2013  1:39 PM

## 2013-02-21 MED ORDER — RISPERIDONE 1 MG PO TABS
1.0000 mg | ORAL_TABLET | Freq: Every day | ORAL | Status: DC
  Administered 2013-02-21: 1 mg via ORAL
  Filled 2013-02-21: qty 1
  Filled 2013-02-21: qty 2
  Filled 2013-02-21 (×2): qty 1

## 2013-02-21 MED ORDER — TRAZODONE HCL 50 MG PO TABS
75.0000 mg | ORAL_TABLET | Freq: Every evening | ORAL | Status: DC | PRN
  Administered 2013-02-21: 75 mg via ORAL
  Filled 2013-02-21: qty 3

## 2013-02-21 NOTE — Progress Notes (Signed)
Spoke with pt briefly who reported being ready for bed. "I feel like I could just fall out I'm so tired." Reviewed meds and administered without difficulty. Also reviewed fall precautions. Pt given support. Mood and affect remain anxious. Pt denies SI/HI/AVH and remains safe. Lawrence Marseilles

## 2013-02-21 NOTE — Progress Notes (Signed)
[facility] MD Progress Note  02/21/2013 2:35 PM John Gibson  MRN:  [id number] Subjective:  [name] is a 22 y/o male with a past psychiatric history significant for ADHD, combined type, Asperger's Disorder, Schizoaffective Disorder and Learning disorder. The patient reports that he has been stressed and felt irritable.  The patient had been having difficulty this morning. He has a verbal outburst this afternoon.  He reports he has some anger towards his other psychiatrist for confronting him about not going for groups.   Diagnosis:  Axis I: ADHD, combined type, Asperger's Disorder, Schizoaffective Disorder and Learning disorder   ADL's:  Intact  Sleep: Fair  Appetite:  Good  Suicidal Ideation:  Plan:  Patient denies. Intent:  Patient denies. Means:  Patient denies. Homicidal Ideation:  Plan:  Patient denies. Intent:  Patient denies. Means:  Patient denies. AEB (as evidenced by): Behavior on the unit  Psychiatric Specialty Exam: Review of Systems  Constitutional: Negative for fever, chills, weight loss and malaise/fatigue.  Respiratory: Negative for hemoptysis, sputum production, shortness of breath and wheezing.   Cardiovascular: Negative for chest pain, palpitations and leg swelling.  Gastrointestinal: Positive for diarrhea and constipation. Negative for heartburn, nausea, vomiting and abdominal pain.  Neurological: Negative for dizziness, tingling, tremors, sensory change, speech change and focal weakness.    Blood pressure 122/72, pulse 96, temperature 98 F (36.7 C), temperature source Oral, resp. rate 16, height 5' 5.75" (1.67 m), weight 102.967 kg (227 lb).Body mass index is 36.92 kg/(m^2).  General Appearance: Casual and Fairly Groomed  Patent attorney::  Good  Speech:  Pressured  Volume:  Increased  Mood:  Anxious and Irritable  Affect:  Congruent and Full Range  Thought Process:  Coherent, Linear and Logical  Orientation:  Full (Time, Place, and Person)  Thought  Content:  WDL  Suicidal Thoughts:  No  Homicidal Thoughts:  Yes.  without intent/plan-has thoughts  Memory:  Immediate;   Good Recent;   Fair Remote;   Good  Judgement:  Impaired  Insight:  Shallow  Psychomotor Activity:  Normal  Concentration:  Fair  Recall:  Fair  Akathisia:  Negative  Handed:  Right  AIMS (if indicated):   As note in chart.  Assets:  Communication Skills Desire for Improvement Social Support  Sleep:  Number of Hours: 6.5   Current Medications: Current Facility-Administered Medications  Medication Dose Route Frequency Provider Last Rate Last Dose  . acetaminophen (TYLENOL) tablet 650 mg  650 mg Oral Q6H PRN Fransisca Kaufmann, NP      . alum & mag hydroxide-simeth (MAALOX/MYLANTA) 200-200-20 MG/5ML suspension 30 mL  30 mL Oral Q4H PRN Fransisca Kaufmann, NP   30 mL at 02/20/13 1354  . chlordiazePOXIDE (LIBRIUM) capsule 25 mg  25 mg Oral BID PRN Larena Sox, MD      . cloNIDine HCl (KAPVAY) ER tablet 0.1 mg  0.1 mg Oral BH-qamhs Nehemiah Settle, MD   0.1 mg at 02/21/13 1256  . DULoxetine (CYMBALTA) DR capsule 30 mg  30 mg Oral Daily Nehemiah Settle, MD   30 mg at 02/21/13 1253  . magnesium hydroxide (MILK OF MAGNESIA) suspension 30 mL  30 mL Oral Daily PRN Fransisca Kaufmann, NP      . pramoxine-mineral oil-zinc (TUCKS) rectal ointment   Rectal TID PRN Kerry Hough, PA-C      . risperiDONE (RISPERDAL) tablet 0.5 mg  0.5 mg Oral QHS Larena Sox, MD   0.5 mg at 02/20/13 2118  .  traZODone (DESYREL) tablet 75 mg  75 mg Oral QHS Nehemiah Settle, MD   75 mg at 02/20/13 2119    Lab Results: No results found for this or any previous visit (from the past 48 hour(s)).  Physical Findings: AIMS: Facial and Oral Movements Muscles of Facial Expression: None, normal Lips and Perioral Area: None, normal Jaw: None, normal Tongue: None, normal,Extremity Movements Upper (arms, wrists, hands, fingers): None, normal Lower (legs, knees, ankles, toes): None,  normal, Trunk Movements Neck, shoulders, hips: None, normal, Overall Severity Severity of abnormal movements (highest score from questions above): None, normal Incapacitation due to abnormal movements: None, normal Patient's awareness of abnormal movements (rate only patient's report): No Awareness, Dental Status Current problems with teeth and/or dentures?: No Does patient usually wear dentures?: No  CIWA:  CIWA-Ar Total: 1 COWS:  COWS Total Score: 2  Treatment Plan Summary: Daily contact with patient to assess and evaluate symptoms and progress in treatment Medication management  1. Continue Cymbalta 30 mg QD  2. Change Trazodone 50 mg 1 1/2 tab at Qhs insomnia. 3. Increase Risperidone 1mg  QHS. 4. Will decrease librium to 25 mg BID PRN. 5. Continue other prescription medications  6.Encourage in patient hospital program including milieu and group therapy  7. LOS 3-5 days   Medical Decision Making  Problem Points: Established problem, worsening (2) and Review of psycho-social stressors (1)  Data Points: Review or order clinical lab tests (1)  Review of medication regiment & side effects (2)  Review of new medications or change in dosage (2)   I certify that inpatient services furnished can reasonably be expected to improve the patient's condition.   Delonna Ney 02/21/2013, 2:35 PM

## 2013-02-21 NOTE — [facility] Group Notes (Signed)
[facility] LCSW Group Therapy  02/21/2013  10:00  Type of Therapy:  Group Therapy  Participation Level:  Did not attend    Summary of Progress/Problems:   The main focus of today's process group was to identify the patient's current support system and decide on other supports that can be put in place. Four definitions/levels of support were discussed and an exercise was utilized to show how much stronger we become with additional supports. An emphasis was placed on using counselor, doctor, therapy groups, 12-step groups, and problem-specific support groups to expand supports, as well as doing something different than has been done before.    [place], LCSW 02/21/2013 3:07 PM

## 2013-02-21 NOTE — Progress Notes (Signed)
D) Pt upset today and wanting to leave. Signed his 72 hour form at 1500 today. Walked up the hall and would not talk with this Clinical research associate. Was able to take a shower and help himself calm down before coming back to the medication room. Stated that he did not feel well today and did sleep all morning. Affect is flat and mood is irritable. A) Given the 72 hour form to sign. Provided with several 1:1's during this shift. R) was able to calm and is in the dayroom interacting appropriately with his peers.

## 2013-02-21 NOTE — Progress Notes (Signed)
Psychoeducational Group Note  Psychoeducational Group Note  Date: 02/21/2013 Time:  02/21/2013  Group Topic/Focus:  Gratefulness:  The focus of this group is to help patients identify what two things they are most grateful for in their lives. What helps ground them and to center them on their work to their recovery.  Participation Level:  Active  Participation Quality:  Appropriate  Affect:  Appropriate  Cognitive:  Appropriate  Insight:  Lacking  Engagement in Group:  Engaged  Additional Comments:   Jovannie Ulibarri A  
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
## 2013-02-21 NOTE — Progress Notes (Signed)
Psychoeducational Group Note  Date:  02/21/2013 Time:  1015  Group Topic/Focus:  Making Healthy Choices:   The focus of this group is to help patients identify negative/unhealthy choices they were using prior to admission and identify positive/healthier coping strategies to replace them upon discharge.  Participation Level:  Active  Participation Quality:  Appropriate  Affect:  Depressed  Cognitive:  Alert  Insight:  Improving  Engagement in Group:  Improving  Additional Comments:    Awad Gladd A 02/21/2013 

## 2013-02-22 DIAGNOSIS — F411 Generalized anxiety disorder: Secondary | ICD-10-CM

## 2013-02-22 MED ORDER — TRAZODONE 25 MG HALF TABLET: 50.0000 mg | ORAL_TABLET | Freq: Every evening | ORAL | Status: DC | PRN

## 2013-02-22 MED ORDER — DULOXETINE HCL 30 MG PO CPEP: 30.0000 mg | ORAL_CAPSULE | Freq: Every day | ORAL | Status: DC

## 2013-02-22 MED ORDER — RISPERIDONE 1 MG PO TABS: 1.0000 mg | ORAL_TABLET | Freq: Every day | ORAL | Status: DC

## 2013-02-22 MED ORDER — TRAZODONE HCL 50 MG PO TABS: 75.0000 mg | ORAL_TABLET | Freq: Every evening | ORAL | Status: DC | PRN

## 2013-02-22 MED ORDER — CLONIDINE HCL ER 0.1 MG PO TB12: 0.1000 mg | ORAL_TABLET | ORAL | Status: DC

## 2013-02-22 MED ORDER — TRAZODONE 25 MG HALF TABLET: 75.0000 mg | ORAL_TABLET | Freq: Every evening | ORAL | Status: DC | PRN

## 2013-02-22 NOTE — Progress Notes (Signed)
Psychoeducational Group Note  Date:  02/22/2013 Time:  1100  Group Topic/Focus:  Self Care:   The focus of this group is to help patients understand the importance of self-care in order to improve or restore emotional, physical, spiritual, interpersonal, and financial health.  Participation Level: Did Not Attend  Additional Comments:  Patient did not attend group, patient was in process of reviewing discharge information with nurse.  Karleen Hampshire Brittini 02/22/2013, 6:45 PM

## 2013-02-22 NOTE — Progress Notes (Signed)
Patient ID: John Gibson, male   DOB: 1991/04/07, 22 y.o.   MRN: [id number] Patient is discharged ambulatory to take bus home to live with Uncle.  He denies SI/HI.  He verbalizes understanding of his discharge meds and his followup.  He was given scripts and sample meds by MD.  He is expressing appreciation for care received.

## 2013-02-22 NOTE — [facility] Group Notes (Signed)
[facility] LCSW Aftercare Discharge Planning Group Note   02/22/2013 11:19 AM  Participation Quality:  Appropriate  Mood/Affect:  Appropriate  Depression Rating:  1  Anxiety Rating:  1  Thoughts of Suicide:  No  Will you contract for safety?   NA  Current AVH:  No  Plan for Discharge/Comments:  Patient reports doing well and ready to discharge home today.  He will return to uncle's home today. Patient will needs assistance with bus transportation home.  Transportation Means: Patient uses public transportation.  Supports:  Patient has a good support system.   John Gibson, Joesph July
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
## 2013-02-22 NOTE — Progress Notes (Signed)
[facility] Adult Case Management Discharge Plan :  Will you be returning to the same living situation after discharge: Yes,  Patient to return to uncle's home At discharge, do you have transportation home?:Yes,  Patient has transportation home. Do you have the ability to pay for your medications:Yes,  Patient has Medicaid.  Release of information consent forms completed and in the chart;  Patient's signature needed at discharge.  Patient to Follow up at: Follow-up Information   Follow up with [facility] On 02/23/2013. (Please go to [facility]'s walk in clinic on Tuesday, February 23, 2013 or any weekday between 8AM-3PM)    Contact information:   [address] [place], [place]   [address]  [phone]      Patient denies SI/HI:   Patient no longer endorsing SI/HI or other thoughts of self harm.     Safety Planning and Suicide Prevention discussed: .Reviewed with all patients during discharge planning group  Delesa Kawa, Joesph July 02/22/2013, 11:18 AM

## 2013-02-22 NOTE — [facility] Suicide Risk Assessment (Addendum)
[facility] INPATIENT:  Family/Significant Other Suicide Prevention Education  Suicide Prevention Education:  Contact Attempts:  Drexel Iha, [phone]; has been identified by the patient as the family member/significant other with whom the patient will be residing, and identified as the person(s) who will aid the patient in the event of a mental health crisis.  With written consent from the patient, two attempts were made to provide suicide prevention education, prior to and/or following the patient's discharge.  We were unsuccessful in providing suicide prevention education.  A suicide education pamphlet was given to the patient to share with family/significant other.  Date and time of first attempt: 02/19/13 11:45 and 3:00  - Message left with male when call made at 3:00 PM Date and time of second attempt:  02/22/13 at 8:28  -Message left on voicemail.  Writer attempted to contact uncle again at 4:20 PM.  Message left on VM that patient had discharged and would arrive home around 18:00. Wynn Banker 02/22/2013, 8:26 AM

## 2013-02-22 NOTE — Plan of Care (Signed)
Problem: Ineffective individual coping Goal: STG: Patient will participate in after care plan Patient is attending group and discussing issues. He will need assistance with outpatient follow up. [name], LCSW 02/19/2013  Outcome: Completed/Met Date Met:  02/22/13 Patient has attended groups and easily engaged in discussions.  Outpatient follow up is scheduled. [name], LCSW 02/22/2013

## 2013-02-22 NOTE — Tx Team (Signed)
Interdisciplinary Treatment Plan Update   Date Reviewed:  02/22/2013  Time Reviewed:  9:56 AM  Progress in Treatment:   Attending groups: Yes Participating in groups: Yes Taking medication as prescribed: Yes  Tolerating medication: Yes Family/Significant other contact made: No, but attempts made to contact uncle. Patient understands diagnosis: Yes  Discussing patient identified problems/goals with staff: Yes Medical problems stabilized or resolved: Yes Denies suicidal/homicidal ideation: Yes Patient has not harmed self or others: Yes  For review of initial/current patient goals, please see plan of care.  Estimated Length of Stay: Discharge home today.  Reasons for Continued Hospitalization:   New Problems/Goals identified:    Discharge Plan or Barriers:   Home with outpatient follow up at [facility]  Additional Comments:  Patient reports doing much better and denies SI/HI.  He rates symptoms at zero today.   Attendees:  Patient: John Gibson 02/22/2013 9:56 AM   Signature: Mervyn Gay, MD 02/22/2013 9:56 AM  Signature: 02/22/2013 9:56 AM  Signature: Harold Barban, RN 02/22/2013 9:56 AM  Signature:Beverly Terrilee Croak, RN 02/22/2013 9:56 AM  Signature:  02/22/2013 9:56 AM  Signature:  Juline Patch, LCSW 02/22/2013 9:56 AM  Signature:  Reyes Ivan, LCSW 02/22/2013 9:56 AM  Signature:  Maseta Dorley,Care Coordinator 02/22/2013 9:56 AM  Signature: 02/22/2013 9:56 AM  Signature:    Signature:    Signature:      Scribe for Treatment Team:   Juline Patch,  02/22/2013 9:56 AM

## 2013-02-22 NOTE — Plan of Care (Signed)
Problem: Alteration in mood Goal: LTG-Pt's behavior demonstrates decreased signs of depression (Patient's behavior demonstrates decreased signs of depression to the point the patient is safe to return home and continue treatment in an outpatient setting)  Outcome: Completed/Met Date Met:  02/22/13 Patient is rating depression at zero. He has attended groups and easily engaged in discussions.  Outpatient follow up is scheduled. Tashina Credit, LCSW 02/22/2013
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
## 2013-02-24 NOTE — Progress Notes (Signed)
Patient Discharge Instructions:  After Visit Summary (AVS):   Faxed to:  02/24/13 Discharge Summary Note:   Faxed to:  02/24/13 Psychiatric Admission Assessment Note:   Faxed to:  02/24/13 Suicide Risk Assessment - Discharge Assessment:   Faxed to:  02/24/13 Faxed/Sent to the Next Level Care provider:  02/24/13 Faxed to [facility] @ [phone]  Jerelene Redden, 02/24/2013, 2:54 PM

## 2014-08-10 IMAGING — CR DG CHEST 2V
2 series · 2 of 2 positions shown · non-contrast
Comparison: None

CLINICAL DATA: Congestion, cough, hypoxia, asthma, myalgia

CHEST - 2 VIEW

[w chest pa]
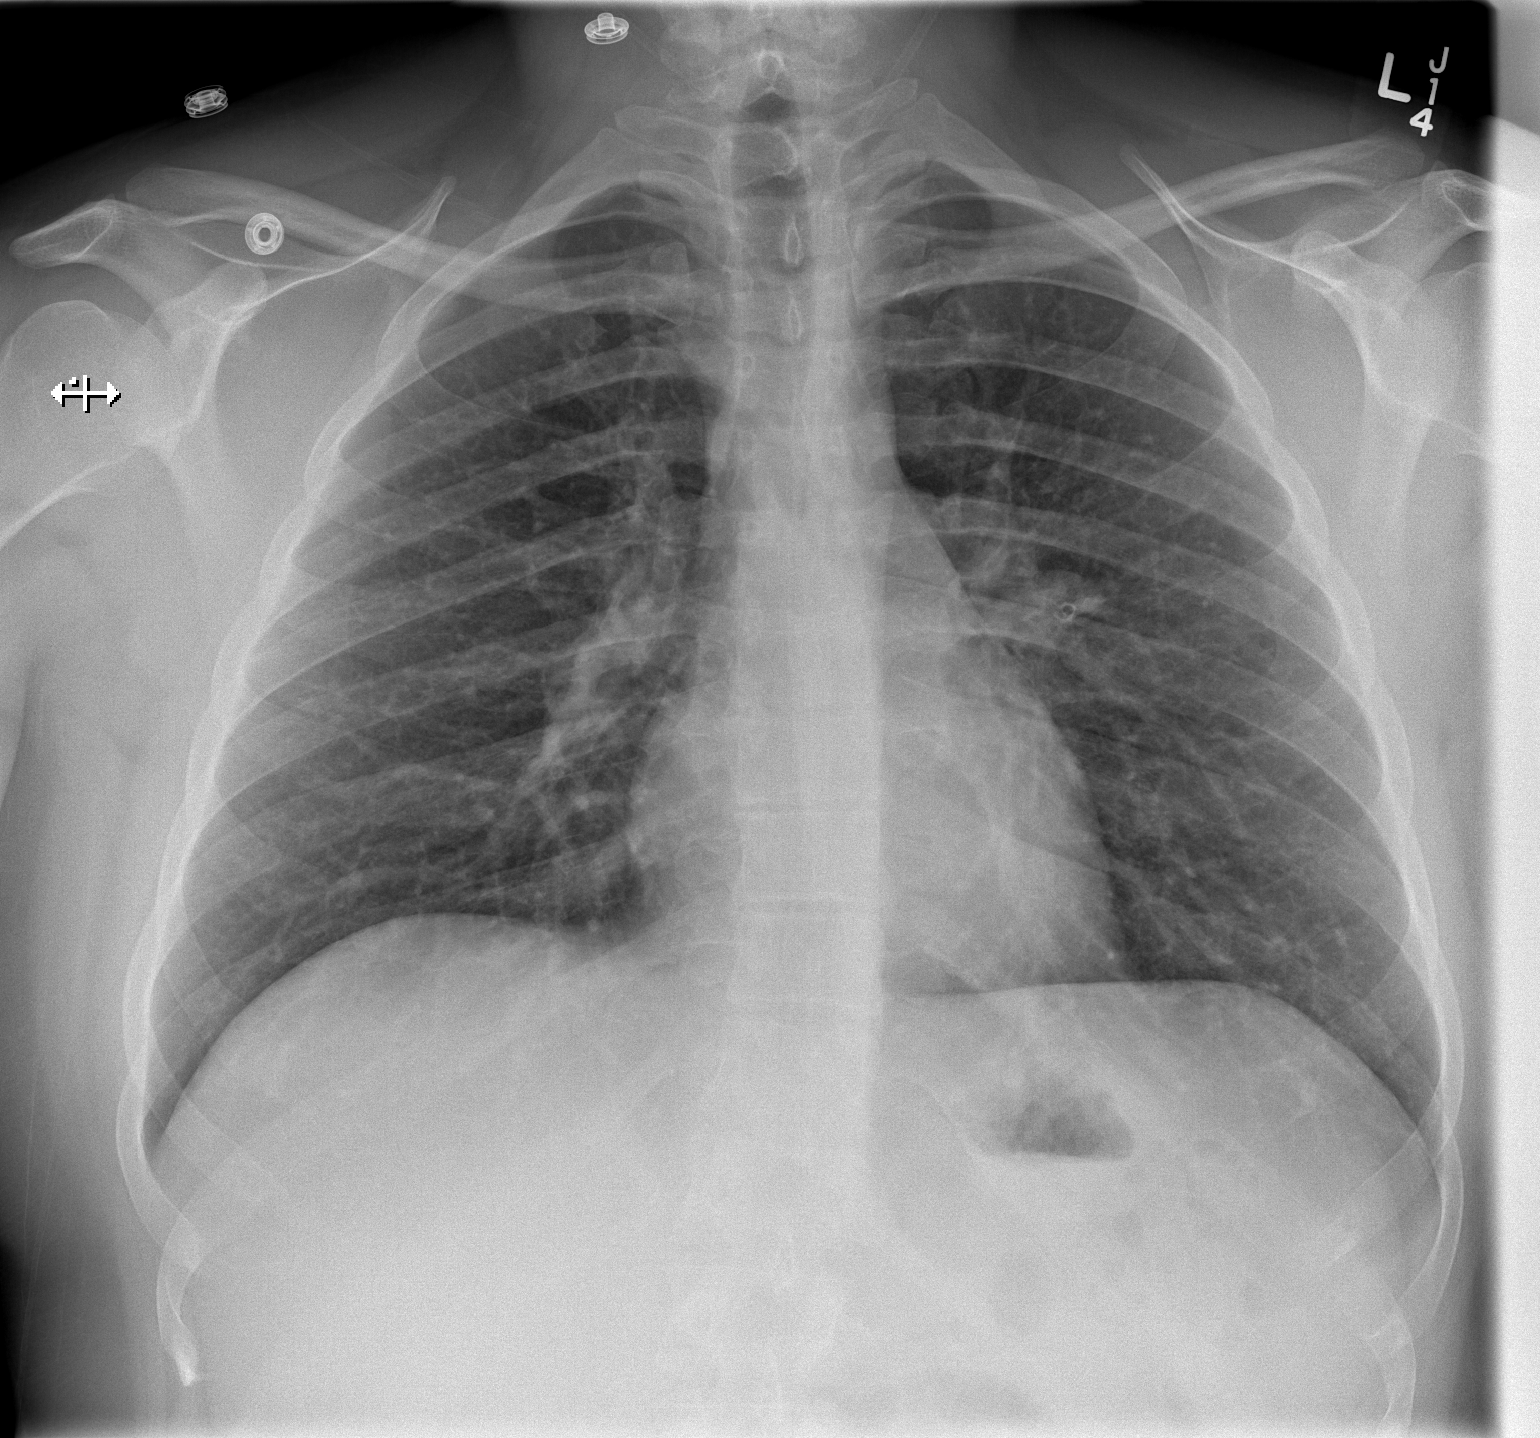

[w chest lat]
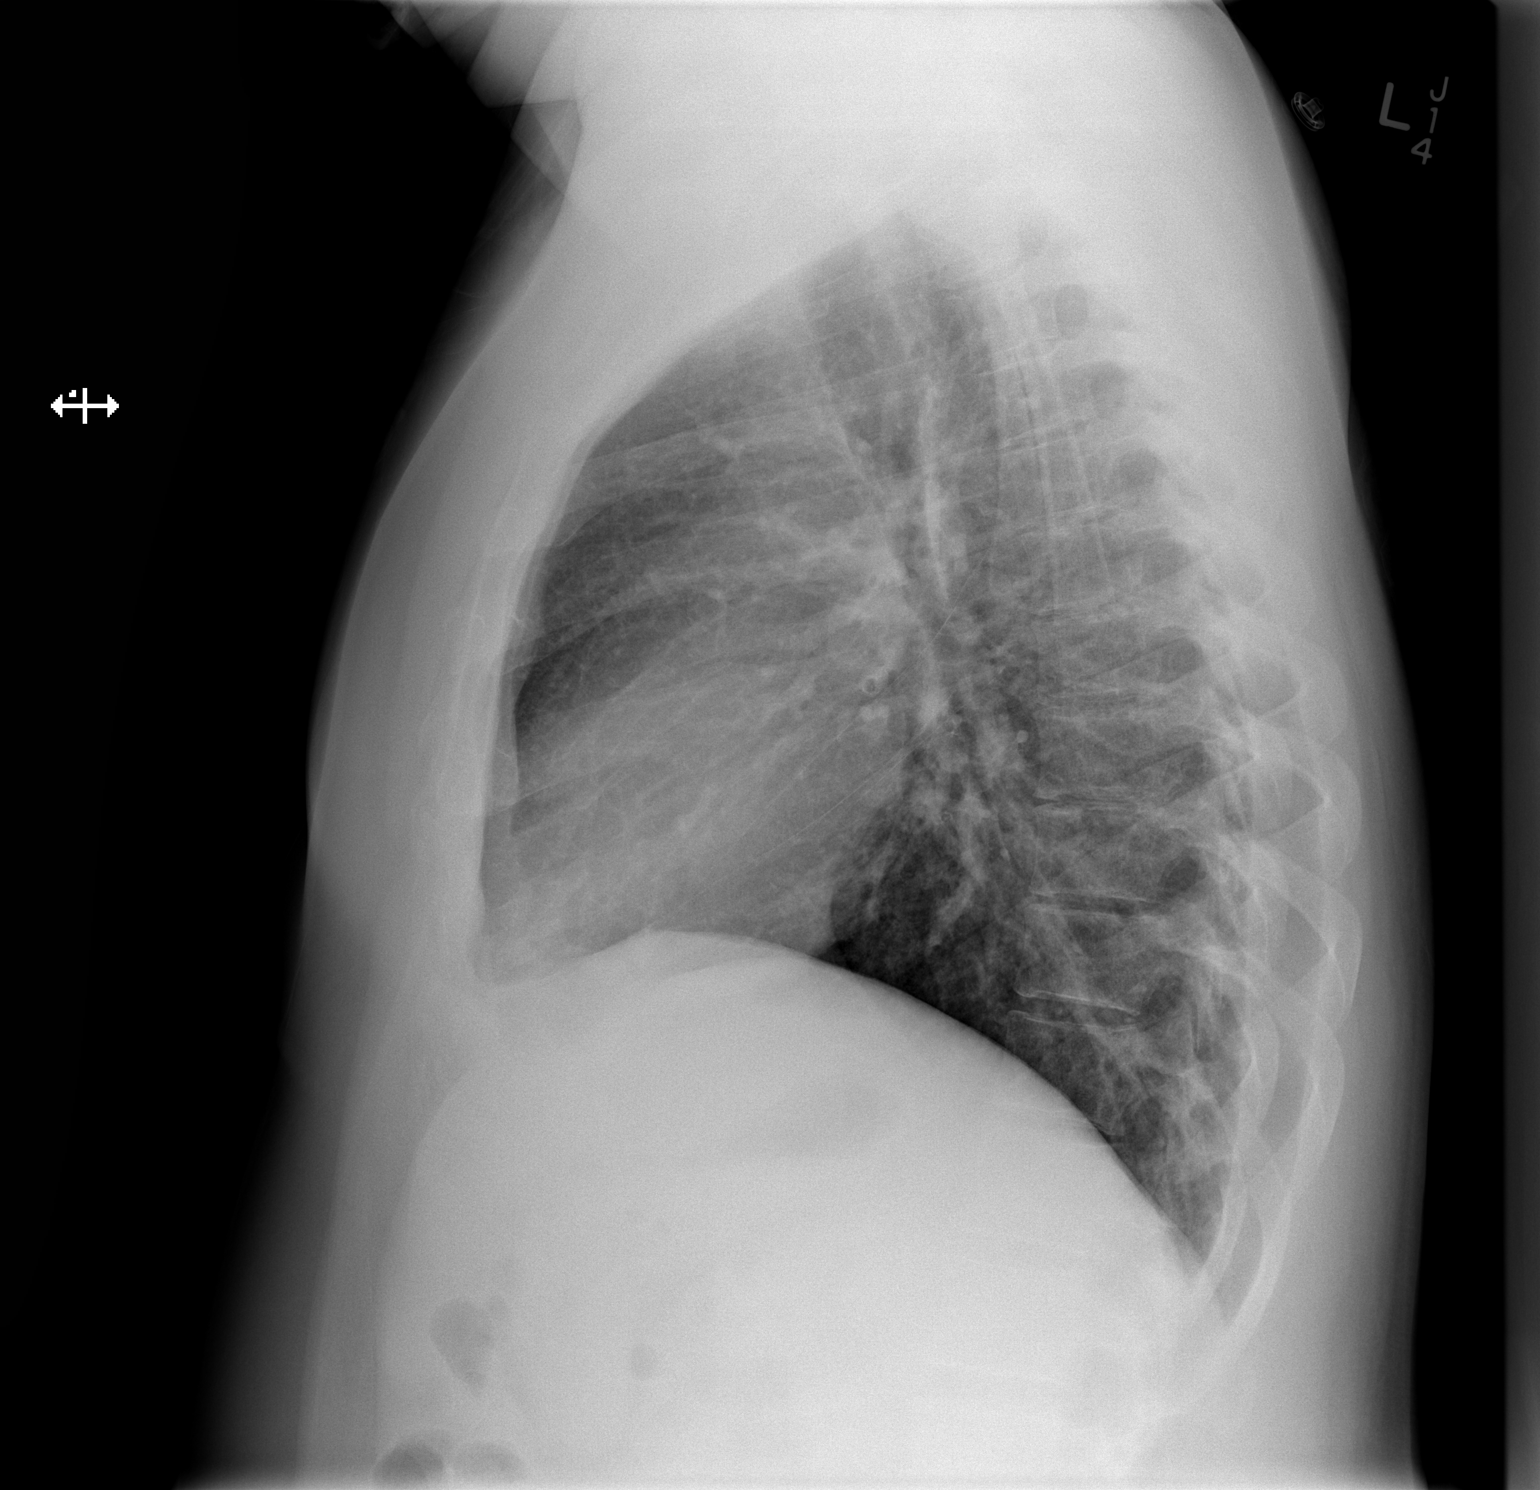

[2 of 2 positions shown; findings below may reference images not displayed]

FINDINGS: Normal heart size, mediastinal contours, and pulmonary vascularity.
Peribronchial thickening, chronic.
No pulmonary infiltrate, pleural effusion, or pneumothorax.
No acute osseous findings.
IMPRESSION: Chronic bronchitic changes.
No acute abnormalities.

## 2016-11-20 ENCOUNTER — Emergency Department (HOSPITAL_COMMUNITY)
Admission: EM | Admit: 2016-11-20 | Discharge: 2016-11-21 | Disposition: A | Discharge status: Home / Self Care | Payer: Medicaid Other | Attending: Emergency Medicine | Admitting: Emergency Medicine

## 2016-11-20 ENCOUNTER — Encounter (HOSPITAL_COMMUNITY): Payer: Self-pay | Admitting: Emergency Medicine

## 2016-11-20 DIAGNOSIS — J45909 Unspecified asthma, uncomplicated: Secondary | ICD-10-CM | POA: Insufficient documentation

## 2016-11-20 DIAGNOSIS — Z658 Other specified problems related to psychosocial circumstances: Secondary | ICD-10-CM

## 2016-11-20 DIAGNOSIS — Z79899 Other long term (current) drug therapy: Secondary | ICD-10-CM | POA: Insufficient documentation

## 2016-11-20 DIAGNOSIS — F1290 Cannabis use, unspecified, uncomplicated: Secondary | ICD-10-CM | POA: Diagnosis not present

## 2016-11-20 DIAGNOSIS — F17210 Nicotine dependence, cigarettes, uncomplicated: Secondary | ICD-10-CM | POA: Diagnosis not present

## 2016-11-20 DIAGNOSIS — Z888 Allergy status to other drugs, medicaments and biological substances status: Secondary | ICD-10-CM | POA: Diagnosis not present

## 2016-11-20 DIAGNOSIS — F250 Schizoaffective disorder, bipolar type: Secondary | ICD-10-CM | POA: Diagnosis present

## 2016-11-20 DIAGNOSIS — Z644 Discord with counselors: Secondary | ICD-10-CM | POA: Insufficient documentation

## 2016-11-20 DIAGNOSIS — F919 Conduct disorder, unspecified: Secondary | ICD-10-CM | POA: Diagnosis present

## 2016-11-20 LAB — CBC WITH DIFFERENTIAL/PLATELET
Basophils Absolute: 0.1 10*3/uL (ref 0.0–0.1)
Basophils Relative: 1 %
Eosinophils Absolute: 0.7 10*3/uL (ref 0.0–0.7)
Eosinophils Relative: 7 %
HEMATOCRIT: 42.6 % (ref 39.0–52.0)
HEMOGLOBIN: 14.1 g/dL (ref 13.0–17.0)
LYMPHS ABS: 2.9 10*3/uL (ref 0.7–4.0)
LYMPHS PCT: 28 %
MCH: 29.1 pg (ref 26.0–34.0)
MCHC: 33.1 g/dL (ref 30.0–36.0)
MCV: 88 fL (ref 78.0–100.0)
MONOS PCT: 5 %
Monocytes Absolute: 0.5 10*3/uL (ref 0.1–1.0)
NEUTROS ABS: 6.1 10*3/uL (ref 1.7–7.7)
Neutrophils Relative %: 59 %
Platelets: 304 10*3/uL (ref 150–400)
RBC: 4.84 MIL/uL (ref 4.22–5.81)
RDW: 13.4 % (ref 11.5–15.5)
WBC: 10.4 10*3/uL (ref 4.0–10.5)

## 2016-11-20 LAB — COMPREHENSIVE METABOLIC PANEL
ALK PHOS: 82 U/L (ref 38–126)
ALT: 49 U/L (ref 17–63)
AST: 30 U/L (ref 15–41)
Albumin: 4 g/dL (ref 3.5–5.0)
Anion gap: 9 (ref 5–15)
BUN: 9 mg/dL (ref 6–20)
CO2: 20 mmol/L — ABNORMAL LOW (ref 22–32)
Calcium: 9 mg/dL (ref 8.9–10.3)
Chloride: 105 mmol/L (ref 101–111)
Creatinine, Ser: 0.88 mg/dL (ref 0.61–1.24)
Glucose, Bld: 111 mg/dL — ABNORMAL HIGH (ref 65–99)
Potassium: 3.3 mmol/L — ABNORMAL LOW (ref 3.5–5.1)
Sodium: 134 mmol/L — ABNORMAL LOW (ref 135–145)
TOTAL PROTEIN: 7.7 g/dL (ref 6.5–8.1)
Total Bilirubin: 0.6 mg/dL (ref 0.3–1.2)

## 2016-11-20 LAB — RAPID URINE DRUG SCREEN, HOSP PERFORMED
Amphetamines: NOT DETECTED
BARBITURATES: NOT DETECTED
Benzodiazepines: NOT DETECTED
COCAINE: NOT DETECTED
Opiates: NOT DETECTED
TETRAHYDROCANNABINOL: NOT DETECTED

## 2016-11-20 LAB — ETHANOL: Alcohol, Ethyl (B): <5 mg/dL (ref <5)

## 2016-11-20 MED ORDER — CLONIDINE HCL ER 0.1 MG PO TB12
0.1000 mg | ORAL_TABLET | ORAL | Status: DC
  Filled 2016-11-20 (×2): qty 1

## 2016-11-20 MED ORDER — BENZTROPINE MESYLATE 1 MG PO TABS
1.0000 mg | ORAL_TABLET | Freq: Two times a day (BID) | ORAL | Status: DC
  Administered 2016-11-20 – 2016-11-21 (×2): 1 mg via ORAL
  Filled 2016-11-20 (×2): qty 1

## 2016-11-20 MED ORDER — TRAZODONE HCL 50 MG PO TABS
50.0000 mg | ORAL_TABLET | Freq: Every evening | ORAL | Status: DC | PRN
  Administered 2016-11-20: 50 mg via ORAL
  Filled 2016-11-20: qty 1

## 2016-11-20 MED ORDER — OLANZAPINE 5 MG PO TABS
15.0000 mg | ORAL_TABLET | Freq: Every day | ORAL | Status: DC
  Administered 2016-11-20: 15 mg via ORAL
  Filled 2016-11-20: qty 1

## 2016-11-20 MED ORDER — DULOXETINE HCL 30 MG PO CPEP
30.0000 mg | ORAL_CAPSULE | Freq: Every day | ORAL | Status: DC
  Administered 2016-11-21: 30 mg via ORAL
  Filled 2016-11-20: qty 1

## 2016-11-20 MED ORDER — RISPERIDONE 1 MG PO TABS: 1.0000 mg | ORAL_TABLET | Freq: Every day | ORAL | Status: DC

## 2016-11-20 NOTE — ED Notes (Signed)
Patient educated about search process and term "contraband " and routine search performed. No contraband found. 
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
## 2016-11-20 NOTE — ED Notes (Signed)
TTS at bedside. 

## 2016-11-20 NOTE — ED Notes (Signed)
Patient changed into paper scrubs. Patient and belongings wanded by security. 

## 2016-11-20 NOTE — ED Notes (Signed)
Bed: [id number] Expected date:  Expected time:  Means of arrival:  Comments: [facility] aggressive pt

## 2016-11-20 NOTE — ED Provider Notes (Signed)
[facility]-EMERGENCY DEPT Provider Note   CSN: [id number] Arrival date & time: 11/20/16  2042     History   Chief Complaint Chief Complaint  Patient presents with  . IVC  . Aggressive Behavior    HPI Ron Ageeric Tison is a 26 y.o. male.  The history is provided by the patient.  Mental Health Problem  Presenting symptoms: agitation (and [facility]) and suicidal threats (documented on IVC paperwork but denies now)   Presenting symptoms: no hallucinations   Patient accompanied by:  Law enforcement Degree of incapacity (severity):  Moderate Onset quality:  Gradual Duration:  3 days Timing:  Constant Progression:  Worsening Chronicity:  Recurrent Context: noncompliance   Treatment compliance:  Untreated Relieved by:  Nothing Worsened by:  Nothing Ineffective treatments:  None tried Risk factors: hx of mental illness     Past Medical History:  Diagnosis Date  . Anxiety   . Arthritis   . Asthma   . GERD (gastroesophageal reflux disease)    IBS  . IBS (irritable bowel syndrome)   . PTSD (post-traumatic stress disorder)   . Schizoaffective disorder [facility](HCC)     Patient Active Problem List   Diagnosis Date Noted  . Asthma exacerbation 12/28/2012  . Hypokalemia 12/28/2012  . Schizoaffective disorder (HCC) 12/28/2012  . Tobacco abuse 12/28/2012    History reviewed. No pertinent surgical history.     Home Medications    Prior to Admission medications   Medication Sig Start Date End Date Taking? Authorizing Provider  cloNIDine HCl (KAPVAY) 0.1 MG TB12 ER tablet Take 1 tablet (0.1 mg total) by mouth 2 (two) times daily in the am and at bedtime.. 02/22/13   Thermon LeylandLaura A Davis, NP  DULoxetine (CYMBALTA) 30 MG capsule Take 1 capsule (30 mg total) by mouth daily. 02/22/13   Thermon LeylandLaura A Davis, NP  risperiDONE (RISPERDAL) 1 MG tablet Take 1 tablet (1 mg total) by mouth at bedtime. For mood control. 02/22/13   Thermon LeylandLaura A Davis, NP  traZODone (DESYREL) 50 MG tablet Take  1.5 tablets (75 mg total) by mouth at bedtime as needed. for sleep 02/22/13   Thermon LeylandLaura A Davis, NP    Family History Family History  Problem Relation Age of Onset  . Family history unknown: Yes    Social History Social History  Substance Use Topics  . Smoking status: Current Every Day Smoker    Packs/day: 2.00    Types: Cigarettes  . Smokeless tobacco: Never Used  . Alcohol use Yes     Comment: Occasionally     Allergies   Buspirone and Divalproex sodium   Review of Systems Review of Systems  Psychiatric/Behavioral: Positive for agitation (and [facility]ading [facility] Negative for hallucinations.  All other systems reviewed and are negative.    Physical Exam Updated Vital Signs BP 125/77 (BP Location: Left Arm)   Temp 98.2 F (36.8 C) (Oral)   Resp 20   Ht 5\' 6"  (1.676 m)   Wt 255 lb (115.7 kg)   SpO2 98%   BMI 41.16 kg/m   Physical Exam  Constitutional: He is oriented to person, place, and time. He appears well-developed and well-nourished. No distress.  HENT:  Head: Normocephalic and atraumatic.  Nose: Nose normal.  Eyes: Conjunctivae are normal.  Neck: Neck supple. No tracheal deviation present.  Cardiovascular: Normal rate, regular rhythm and normal heart sounds.   Pulmonary/Chest: Effort normal and breath sounds normal. No respiratory distress.  Abdominal: Soft.  He exhibits no distension.  Neurological: He is alert and oriented to person, place, and time.  Skin: Skin is warm and dry.  Psychiatric: He has a normal mood and affect. His speech is normal and behavior is normal. Thought content normal. He expresses no homicidal and no suicidal ideation. He expresses no suicidal plans and no homicidal plans.     ED Treatments / Results  Labs (all labs ordered are listed, but only abnormal results are displayed) Labs Reviewed  COMPREHENSIVE METABOLIC PANEL - Abnormal; Notable for the following:       Result Value   Sodium 134 (*)     Potassium 3.3 (*)    CO2 20 (*)    Glucose, Bld 111 (*)    All other components within normal limits  ETHANOL  CBC WITH DIFFERENTIAL/PLATELET  RAPID URINE DRUG SCREEN, HOSP PERFORMED    EKG  EKG Interpretation None       Radiology No results found.  Procedures Procedures (including critical care time)  Medications Ordered in ED Medications  DULoxetine (CYMBALTA) DR capsule 30 mg (not administered)  traZODone (DESYREL) tablet 50 mg (50 mg Oral Given 11/20/16 2249)  OLANZapine (ZYPREXA) tablet 15 mg (15 mg Oral Given 11/20/16 2249)  benztropine (COGENTIN) tablet 1 mg (1 mg Oral Given 11/20/16 2249)     Initial Impression / Assessment and Plan / ED Course  I have reviewed the triage vital signs and the nursing notes.  Pertinent labs & imaging results that were available during my care of the patient were reviewed by me and considered in my medical decision making (see chart for details).     26 year old male presents with behavioral difficulties from [place] where he was attempting to psychiatrically stabilized. He tells me that he was barricading himself in the room because he got into some disagreements with other residents about seating arrangements and felt that staff was being rude to him so he didn't want to talk to them anymore. He comes with IVC paperwork that says he threatened to run in front of a bus and he had been having auditory hallucinations. He denies SI, HI, AVH on my exam and states that he just wanted to go to a different place and he needs a day or 2 more of his medications and he will be fine to go home. He has a plan to go back with his family and continue medications.  Due to his high risk behavior and underlying psychiatric illness, TTS was consulted to evaluate for safety. We will start his stated home psychiatry medications pending evaluation.  MEDICALLY CLEAR FOR TRANSFER OR PSYCHIATRIC ADMISSION.   Final Clinical Impressions(s) / ED Diagnoses    Final diagnoses:  Social discord    New Prescriptions New Prescriptions   No medications on file     Lyndal Pulley, MD 11/21/16 [id number]

## 2016-11-20 NOTE — ED Notes (Signed)
Patient currently denies SI,HI and AVH at this time. Patient states that "I feel like I'm stable and go back in society. [facility] and been back on medication. Encouragement and support provided and safety maintain. Q 15 min safety checks in place and video monitoring.
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
## 2016-11-20 NOTE — ED Notes (Signed)
ED Provider at bedside. 

## 2016-11-21 DIAGNOSIS — F250 Schizoaffective disorder, bipolar type: Secondary | ICD-10-CM | POA: Diagnosis not present

## 2016-11-21 DIAGNOSIS — Z79899 Other long term (current) drug therapy: Secondary | ICD-10-CM

## 2016-11-21 DIAGNOSIS — Z888 Allergy status to other drugs, medicaments and biological substances status: Secondary | ICD-10-CM

## 2016-11-21 DIAGNOSIS — F17210 Nicotine dependence, cigarettes, uncomplicated: Secondary | ICD-10-CM

## 2016-11-21 DIAGNOSIS — F1290 Cannabis use, unspecified, uncomplicated: Secondary | ICD-10-CM | POA: Diagnosis not present

## 2016-11-21 MED ORDER — BENZTROPINE MESYLATE 1 MG PO TABS: 1.0000 mg | ORAL_TABLET | Freq: Every day | ORAL | 0 refills | Status: DC

## 2016-11-21 MED ORDER — OLANZAPINE 15 MG PO TABS: 15.0000 mg | ORAL_TABLET | Freq: Every day | ORAL | 0 refills | Status: DC

## 2016-11-21 MED ORDER — DULOXETINE HCL 30 MG PO CPEP: 30.0000 mg | ORAL_CAPSULE | Freq: Every day | ORAL | 0 refills | Status: DC

## 2016-11-21 MED ORDER — GABAPENTIN 300 MG PO CAPS: 300.0000 mg | ORAL_CAPSULE | Freq: Two times a day (BID) | ORAL | 0 refills | Status: DC

## 2016-11-21 MED ORDER — TRAZODONE HCL 50 MG PO TABS: 50.0000 mg | ORAL_TABLET | Freq: Every evening | ORAL | 0 refills | Status: DC | PRN

## 2016-11-21 NOTE — BH Assessment (Signed)
[facility] Assessment Progress Note  Per John MinsMojeed Akintayo, MD, this pt does not require psychiatric hospitalization at this time.  Pt presents [facility], which John Gibson has rescinded.  [facility].  He is also to be provided with information for supportive services for the homeless.  This has been included in pt's discharge instructions.  Pt's nurse, John Gibson, has been notified.  John Canninghomas Juwan Vences, MA Triage Specialist [phone]
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
## 2016-11-21 NOTE — [facility] Suicide Risk Assessment (Signed)
Suicide Risk Assessment  Discharge Assessment   [facility]   Principal Problem: Schizoaffective disorder, bipolar type [facility](HCC) Discharge Diagnoses:  Patient Active Problem List   Diagnosis Date Noted  . Schizoaffective disorder, bipolar type (HCC) [F25.0] 12/28/2012    Priority: High  . Asthma exacerbation [J45.901] 12/28/2012  . Hypokalemia [E87.6] 12/28/2012  . Tobacco abuse [Z72.0] 12/28/2012    Total Time spent with patient: 45 minutes  Musculoskeletal: Strength & Muscle Tone: within normal limits Gait & Station: normal Patient leans: N/A  Psychiatric Specialty Exam: Physical Exam  Constitutional: He is oriented to person, place, and time. He appears well-developed and well-nourished.  HENT:  Head: Normocephalic.  Neck: Normal range of motion.  Respiratory: Effort normal.  GI: Soft.  Musculoskeletal: Normal range of motion.  Neurological: He is alert and oriented to person, place, and time.  Psychiatric: He has a normal mood and affect. His speech is normal and behavior is normal. Judgment and thought content normal. Cognition and memory are normal.    Review of Systems  All other systems reviewed and are negative.   Blood pressure 121/59, pulse 67, temperature 98.1 F (36.7 C), temperature source Oral, resp. rate 17, height 5\' 6"  (1.676 m), weight 115.7 kg (255 lb), SpO2 98 %.Body mass index is 41.16 kg/m.  General Appearance: Casual  Eye Contact:  Good  Speech:  Normal Rate  Volume:  Normal  Mood:  Euthymic  Affect:  Congruent  Thought Process:  Coherent and Descriptions of Associations: Intact  Orientation:  Full (Time, Place, and Person)  Thought Content:  WDL and Logical  Suicidal Thoughts:  No  Homicidal Thoughts:  No  Memory:  Immediate;   Good Recent;   Good Remote;   Good  Judgement:  Fair  Insight:  Fair  Psychomotor Activity:  Normal  Concentration:  Concentration: Good and Attention Span: Good  Recall:  Good  Fund of  Knowledge:  Fair  Language:  Good  Akathisia:  No  Handed:  Right  AIMS (if indicated):     Assets:  Leisure Time Physical Health Resilience  ADL's:  Intact  Cognition:  WNL  Sleep:       Mental Status Per Nursing Assessment::   On Admission:   suicidal/homicidal ideations  Demographic Factors:  Male and Caucasian  Loss Factors: NA  Historical Factors: NA  Risk Reduction Factors:   Sense of responsibility to family and Positive therapeutic relationship  Continued Clinical Symptoms:  None  Cognitive Features That Contribute To Risk:  None    Suicide Risk:  Minimal: No identifiable suicidal ideation.  Patients presenting with no risk factors but with morbid ruminations; may be classified as minimal risk based on the severity of the depressive symptoms    Plan Of Care/Follow-up recommendations:  Activity:  as tolerated Diet:  heart healhty diet  Shelby Anderle, NP 11/21/2016, 10:14 AM

## 2016-11-21 NOTE — ED Notes (Signed)
Patient has a [facility] assessment in the front of his chart. Assessment states that patient is homeless.

## 2016-11-21 NOTE — ED Notes (Signed)
Pt discharged ambulatory with resources.  Discharge instructions and RX were reviewed.  Pt was planning on calling his ACT team after discharge.  All belongings were returned and bus pass was given.

## 2016-11-21 NOTE — Discharge Instructions (Signed)
For your ongoing mental health needs, you are advised to follow up with [facility].  If you do not currently have an appointment, new and returning patients are seen at their walk-in clinic.  Walk-in hours are Monday - Friday from 8:00 am - 3:00 pm.  Walk-in patients are seen on a first come, first served basis.  Try to arrive as early as possible for he best chance of being seen the same day:       [facility]      [address]      [place], [place] [address]      [phone]  For your shelter needs, contact the following service providers:       [facility]      [address]      [place], [place] [address]      [phone]) [phone]       [facility]      [address]      [place], [place] [address]      [phone]) [phone]  For day shelter and other supportive services for the homeless, contact the [facility]):       [facility]      [address]      [place], [place] [address]      [phone]  For transitional housing, contact one of the following agencies.  They provide longer term housing than a shelter, but there is an application process:       [facility]      [facility]      [address]      [place], [place] [address]      [phone]) [phone]

## 2017-01-02 ENCOUNTER — Encounter (HOSPITAL_COMMUNITY): Payer: Self-pay | Admitting: Emergency Medicine

## 2017-01-02 ENCOUNTER — Emergency Department (HOSPITAL_COMMUNITY)
Admission: EM | Admit: 2017-01-02 | Discharge: 2017-01-02 | Disposition: A | Discharge status: Home / Self Care | Payer: Medicaid Other | Attending: Emergency Medicine | Admitting: Emergency Medicine

## 2017-01-02 DIAGNOSIS — F419 Anxiety disorder, unspecified: Secondary | ICD-10-CM | POA: Diagnosis present

## 2017-01-02 DIAGNOSIS — F17210 Nicotine dependence, cigarettes, uncomplicated: Secondary | ICD-10-CM | POA: Diagnosis not present

## 2017-01-02 DIAGNOSIS — Z79899 Other long term (current) drug therapy: Secondary | ICD-10-CM | POA: Insufficient documentation

## 2017-01-02 DIAGNOSIS — J45909 Unspecified asthma, uncomplicated: Secondary | ICD-10-CM | POA: Diagnosis not present

## 2017-01-02 MED ORDER — HYDROXYZINE HCL 10 MG PO TABS
40.0000 mg | ORAL_TABLET | Freq: Once | ORAL | Status: AC
Start: 2017-01-02 — End: 2017-01-02
  Administered 2017-01-02: 40 mg via ORAL
  Filled 2017-01-02: qty 4
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
## 2017-01-02 NOTE — ED Provider Notes (Signed)
[facility]-EMERGENCY DEPT Provider Note   CSN: [id number] Arrival [id number]: 01/02/17  0157    History   Chief Complaint Chief Complaint  Patient presents with  . Panic Attack  . Medication Refill    HPI John Gibson is a 26 y.o. male.  26 year old male with a hx of anxiety, IBS, PTSD, and schizoaffective disorder presents to the ED for worsening anxiety. Symptoms acutely worsened after his partner broke up with him tonight. No SI/HI. Patient states that his demeanor has been fluctuating secondary to noncompliance with his prescribed medications. He states that he feels as though he needs something "to help me calm down". He expresses interest in resources specifically geared toward the LGBT community. Last hospitalization was ~3 weeks ago, per patient.   The history is provided by the patient. No language interpreter was used.  Medication Refill    Past Medical History:  Diagnosis Date  . Anxiety   . Arthritis   . Asthma   . GERD (gastroesophageal reflux disease)    IBS  . IBS (irritable bowel syndrome)   . PTSD (post-traumatic stress disorder)   . Schizoaffective disorder [facility])     Patient Active Problem List   Diagnosis Date Noted  . Asthma exacerbation 12/28/2012  . Hypokalemia 12/28/2012  . Schizoaffective disorder, bipolar type (HCC) 12/28/2012  . Tobacco abuse 12/28/2012    Past Surgical History:  Procedure Laterality Date  . arm surgery    . extraction of wisdom teeth    . MOUTH SURGERY         Home Medications    Prior to Admission medications   Medication Sig Start Date End Date Taking? Authorizing Provider  benztropine (COGENTIN) 1 MG tablet Take 1 tablet (1 mg total) by mouth daily. 11/21/16  Yes Charm Rings, NP  DULoxetine (CYMBALTA) 30 MG capsule Take 1 capsule (30 mg total) by mouth daily. 11/21/16  Yes Charm Rings, NP  gabapentin (NEURONTIN) 300 MG capsule Take 1 capsule (300 mg total) by mouth 2 (two) times daily. 11/21/16  Yes Charm Rings, NP  GuanFACINE HCl (INTUNIV PO) Take 1 tablet by mouth daily.   Yes Historical Provider, MD  Lurasidone HCl (LATUDA PO) Take 1 tablet by mouth daily.   Yes Historical Provider, MD  OLANZapine (ZYPREXA) 15 MG tablet Take 1 tablet (15 mg total) by mouth at bedtime. 11/21/16  Yes Charm Rings, NP  traZODone (DESYREL) 50 MG tablet Take 1 tablet (50 mg total) by mouth at bedtime as needed for sleep. 11/21/16  Yes Charm Rings, NP    Family History Family History  Problem Relation Age of Onset  . Family history unknown: Yes    Social History Social History  Substance Use Topics  . Smoking status: Current Every Day Smoker    Packs/day: 2.00    Types: Cigarettes  . Smokeless tobacco: Never Used  . Alcohol use Yes     Comment: Occasionally     Allergies   Buspirone and Divalproex sodium   Review of Systems Review of Systems Ten systems reviewed and are negative for acute change, except as noted in the HPI.    Physical Exam Updated Vital Signs BP 119/61 (BP Location: Left Arm)   Pulse 74   Temp 98.4 F (36.9 C) (Oral)   Resp 18   SpO2 99%   Physical Exam  Constitutional: He is oriented to person, place, and time. He appears well-developed and well-nourished. No distress.  Nontoxic  and in NAD  HENT:  Head: Normocephalic and atraumatic.  Eyes: Conjunctivae and EOM are normal. No scleral icterus.  Neck: Normal range of motion.  Cardiovascular: Normal rate, regular rhythm and intact distal pulses.   Pulmonary/Chest: Effort normal. No respiratory distress.  Respirations even and unlabored  Musculoskeletal: Normal range of motion.  Neurological: He is alert and oriented to person, place, and time. He exhibits normal muscle tone. Coordination normal.  Skin: Skin is warm and dry. No rash noted. He is not diaphoretic. No erythema. No pallor.  Psychiatric: His behavior is normal. His mood appears anxious (mild). His speech is rapid and/or pressured. He expresses no  homicidal and no suicidal ideation.  Nursing note and vitals reviewed.    ED Treatments / Results  Labs (all labs ordered are listed, but only abnormal results are displayed) Labs Reviewed - No data to display  EKG  EKG Interpretation None       Radiology No results found.  Procedures Procedures (including critical care time)  Medications Ordered in ED Medications  hydrOXYzine (ATARAX/VISTARIL) tablet 40 mg (40 mg Oral Given 01/02/17 0604)     Initial Impression / Assessment and Plan / ED Course  I have reviewed the triage vital signs and the nursing notes.  Pertinent labs & imaging results that were available during my care of the patient were reviewed by me and considered in my medical decision making (see chart for details).     Patient presenting for acute worsening of anxiety. He was given his prescribed Vistaril for this. No SI/HI. Patient evaluated by TTS who agree with continued outpatient management. Will provide resource guide. Patient discharged in stable condition with no unaddressed concerns.   Final Clinical Impressions(s) / ED Diagnoses   Final diagnoses:  Anxiety disorder, unspecified type    New Prescriptions Discharge Medication List as of 01/02/2017  6:27 AM       Antony Madura, PA-C 01/02/17 0705    April Palumbo, MD 01/02/17 [phone]
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
## 2017-01-07 ENCOUNTER — Encounter (HOSPITAL_COMMUNITY): Payer: Self-pay | Admitting: Emergency Medicine

## 2017-01-07 ENCOUNTER — Emergency Department (HOSPITAL_COMMUNITY)
Admission: EM | Admit: 2017-01-07 | Discharge: 2017-01-09 | Disposition: A | Discharge status: Home / Self Care | Payer: Medicaid Other | Attending: Physician Assistant | Admitting: Physician Assistant

## 2017-01-07 DIAGNOSIS — F250 Schizoaffective disorder, bipolar type: Secondary | ICD-10-CM | POA: Diagnosis not present

## 2017-01-07 DIAGNOSIS — Z79899 Other long term (current) drug therapy: Secondary | ICD-10-CM | POA: Diagnosis not present

## 2017-01-07 DIAGNOSIS — J45909 Unspecified asthma, uncomplicated: Secondary | ICD-10-CM | POA: Diagnosis not present

## 2017-01-07 DIAGNOSIS — R45851 Suicidal ideations: Secondary | ICD-10-CM | POA: Insufficient documentation

## 2017-01-07 DIAGNOSIS — F3289 Other specified depressive episodes: Secondary | ICD-10-CM | POA: Insufficient documentation

## 2017-01-07 DIAGNOSIS — F17210 Nicotine dependence, cigarettes, uncomplicated: Secondary | ICD-10-CM | POA: Diagnosis not present

## 2017-01-07 DIAGNOSIS — F4310 Post-traumatic stress disorder, unspecified: Secondary | ICD-10-CM | POA: Diagnosis not present

## 2017-01-07 DIAGNOSIS — F1290 Cannabis use, unspecified, uncomplicated: Secondary | ICD-10-CM | POA: Diagnosis not present

## 2017-01-07 LAB — CBC WITH DIFFERENTIAL/PLATELET
BASOS ABS: 0.1 10*3/uL (ref 0.0–0.1)
BASOS PCT: 1 %
EOS ABS: 0.4 10*3/uL (ref 0.0–0.7)
Eosinophils Relative: 4 %
HCT: 42 % (ref 39.0–52.0)
HEMOGLOBIN: 14.4 g/dL (ref 13.0–17.0)
LYMPHS ABS: 3.1 10*3/uL (ref 0.7–4.0)
Lymphocytes Relative: 30 %
MCH: 30.5 pg (ref 26.0–34.0)
MCHC: 34.3 g/dL (ref 30.0–36.0)
MCV: 89 fL (ref 78.0–100.0)
MONOS PCT: 5 %
Monocytes Absolute: 0.6 10*3/uL (ref 0.1–1.0)
Neutro Abs: 6.1 10*3/uL (ref 1.7–7.7)
Neutrophils Relative %: 60 %
Platelets: 329 10*3/uL (ref 150–400)
RBC: 4.72 MIL/uL (ref 4.22–5.81)
RDW: 13.4 % (ref 11.5–15.5)
WBC: 10.3 10*3/uL (ref 4.0–10.5)

## 2017-01-07 LAB — RAPID URINE DRUG SCREEN, HOSP PERFORMED
Amphetamines: NOT DETECTED
BENZODIAZEPINES: NOT DETECTED
Barbiturates: NOT DETECTED
COCAINE: NOT DETECTED
OPIATES: NOT DETECTED
TETRAHYDROCANNABINOL: NOT DETECTED

## 2017-01-07 LAB — COMPREHENSIVE METABOLIC PANEL
ALT: 33 U/L (ref 17–63)
AST: 30 U/L (ref 15–41)
Albumin: 4.1 g/dL (ref 3.5–5.0)
Alkaline Phosphatase: 66 U/L (ref 38–126)
Anion gap: 6 (ref 5–15)
BUN: 6 mg/dL (ref 6–20)
CALCIUM: 8.9 mg/dL (ref 8.9–10.3)
CO2: 25 mmol/L (ref 22–32)
CREATININE: 0.94 mg/dL (ref 0.61–1.24)
Chloride: 106 mmol/L (ref 101–111)
GFR calc non Af Amer: >60 mL/min (ref >60)
GLUCOSE: 96 mg/dL (ref 65–99)
Potassium: 3.7 mmol/L (ref 3.5–5.1)
SODIUM: 137 mmol/L (ref 135–145)
Total Bilirubin: 0.6 mg/dL (ref 0.3–1.2)
Total Protein: 7.2 g/dL (ref 6.5–8.1)

## 2017-01-07 LAB — ACETAMINOPHEN LEVEL: Acetaminophen (Tylenol), Serum: <10 ug/mL — ABNORMAL LOW (ref 10–30)

## 2017-01-07 LAB — SALICYLATE LEVEL: Salicylate Lvl: <7 mg/dL (ref 2.8–30.0)

## 2017-01-07 LAB — ETHANOL: Alcohol, Ethyl (B): <5 mg/dL (ref <5)

## 2017-01-07 MED ORDER — LIDOCAINE HCL (PF) 1 % IJ SOLN
2.0000 mL | Freq: Once | INTRAMUSCULAR | Status: AC
  Administered 2017-01-07: 2 mL via INTRADERMAL
  Filled 2017-01-07: qty 5

## 2017-01-07 MED ORDER — MIDAZOLAM HCL 2 MG/2ML IJ SOLN
5.0000 mg | Freq: Once | INTRAMUSCULAR | Status: AC
  Administered 2017-01-07: 5 mg via INTRAMUSCULAR
  Filled 2017-01-07: qty 6

## 2017-01-07 MED ORDER — ALUM & MAG HYDROXIDE-SIMETH 200-200-20 MG/5ML PO SUSP: 30.0000 mL | ORAL | Status: DC | PRN

## 2017-01-07 MED ORDER — GABAPENTIN 300 MG PO CAPS
300.0000 mg | ORAL_CAPSULE | Freq: Two times a day (BID) | ORAL | Status: DC
  Administered 2017-01-08 – 2017-01-09 (×3): 300 mg via ORAL
  Filled 2017-01-07 (×3): qty 1

## 2017-01-07 MED ORDER — CEFTRIAXONE SODIUM 250 MG IJ SOLR
250.0000 mg | Freq: Once | INTRAMUSCULAR | Status: AC
  Administered 2017-01-07: 250 mg via INTRAMUSCULAR
  Filled 2017-01-07: qty 250

## 2017-01-07 MED ORDER — HALOPERIDOL LACTATE 5 MG/ML IJ SOLN
5.0000 mg | Freq: Once | INTRAMUSCULAR | Status: AC
  Administered 2017-01-07: 5 mg via INTRAMUSCULAR
  Filled 2017-01-07: qty 1

## 2017-01-07 MED ORDER — TRAZODONE HCL 50 MG PO TABS: 50.0000 mg | ORAL_TABLET | Freq: Every evening | ORAL | Status: DC | PRN

## 2017-01-07 MED ORDER — BENZTROPINE MESYLATE 1 MG PO TABS
1.0000 mg | ORAL_TABLET | Freq: Every day | ORAL | Status: DC
  Administered 2017-01-08 – 2017-01-09 (×2): 1 mg via ORAL
  Filled 2017-01-07 (×2): qty 1

## 2017-01-07 MED ORDER — DIPHENHYDRAMINE HCL 50 MG/ML IJ SOLN
25.0000 mg | Freq: Once | INTRAMUSCULAR | Status: AC
  Administered 2017-01-07: 25 mg via INTRAMUSCULAR
  Filled 2017-01-07: qty 1

## 2017-01-07 MED ORDER — NICOTINE POLACRILEX 2 MG MT GUM
2.0000 mg | CHEWING_GUM | OROMUCOSAL | Status: DC | PRN
  Administered 2017-01-08 – 2017-01-09 (×2): 2 mg via ORAL
  Filled 2017-01-07 (×2): qty 1

## 2017-01-07 MED ORDER — OLANZAPINE 5 MG PO TABS
15.0000 mg | ORAL_TABLET | Freq: Every day | ORAL | Status: DC
  Administered 2017-01-08: 15 mg via ORAL
  Filled 2017-01-07: qty 3

## 2017-01-07 MED ORDER — DULOXETINE HCL 30 MG PO CPEP
30.0000 mg | ORAL_CAPSULE | Freq: Every day | ORAL | Status: DC
  Administered 2017-01-08 – 2017-01-09 (×2): 30 mg via ORAL
  Filled 2017-01-07 (×2): qty 1

## 2017-01-07 MED ORDER — IBUPROFEN 400 MG PO TABS: 600.0000 mg | ORAL_TABLET | Freq: Three times a day (TID) | ORAL | Status: DC | PRN

## 2017-01-07 MED ORDER — AZITHROMYCIN 250 MG PO TABS
1000.0000 mg | ORAL_TABLET | Freq: Once | ORAL | Status: AC
  Administered 2017-01-07: 1000 mg via ORAL
  Filled 2017-01-07: qty 4

## 2017-01-07 NOTE — ED Notes (Addendum)
This RN on phone with receiving RN when pt came storming out of room, agitated and yelling "I'm leaving. I don't want nothing to do with her. I'm talking to her and she won't listen to me. I was fine until she showed up." Security notified. MD aware. Pt redirected into new room assignment. Pt belongings placed in Pod F nurse's station at this time.

## 2017-01-07 NOTE — ED Notes (Signed)
Dinner at bedside. Pt resting quietly in room with sitter at bedside.

## 2017-01-07 NOTE — ED Notes (Signed)
Sitter changed shifts. [place], EMT now at bedside and sitting with pt.

## 2017-01-07 NOTE — ED Notes (Signed)
Pt ate about 25% of dinner. Currently resting with sitter at bedside.

## 2017-01-07 NOTE — ED Notes (Signed)
tts at bedside, patient is very angry and aggressive, Ardine Eng is getting an order from the edp to medicate the patient

## 2017-01-07 NOTE — ED Triage Notes (Signed)
Pt sts SI with attempt to cut wrists with stick; pt picked up by [employer] and brought to ED for eval
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
## 2017-01-07 NOTE — ED Notes (Signed)
Lorenda Hatchet, patient's partner - [phone]

## 2017-01-07 NOTE — BH Assessment (Signed)
Attempted to a consult with the pt; however, pt refused to cooperate, appears very unstable emotionally as evidenced by using explicit language, verbalizing he is not going to talk to anyone, yelling "Get me out of here," and cutting off the tele psyche machine during the consult.  Will ask the nurse to pull the consult and reenter with the pt is calmer or stable enough to engage in the consult.
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
## 2017-01-08 DIAGNOSIS — F1290 Cannabis use, unspecified, uncomplicated: Secondary | ICD-10-CM

## 2017-01-08 DIAGNOSIS — R45851 Suicidal ideations: Secondary | ICD-10-CM

## 2017-01-08 DIAGNOSIS — F17210 Nicotine dependence, cigarettes, uncomplicated: Secondary | ICD-10-CM | POA: Diagnosis not present

## 2017-01-08 DIAGNOSIS — F250 Schizoaffective disorder, bipolar type: Secondary | ICD-10-CM

## 2017-01-08 MED ORDER — HALOPERIDOL LACTATE 5 MG/ML IJ SOLN
5.0000 mg | Freq: Four times a day (QID) | INTRAMUSCULAR | Status: DC | PRN
  Administered 2017-01-09: 5 mg via INTRAMUSCULAR
  Filled 2017-01-08: qty 1

## 2017-01-08 MED ORDER — BENZTROPINE MESYLATE 1 MG/ML IJ SOLN
1.0000 mg | Freq: Two times a day (BID) | INTRAMUSCULAR | Status: DC | PRN
  Filled 2017-01-08: qty 2

## 2017-01-08 NOTE — ED Notes (Addendum)
Pt refusing to be re-evaluated this morning.  States he is not a morning person.  Dr Criss Alvine at beside to determine next steps.

## 2017-01-08 NOTE — ED Notes (Signed)
Pt apologized for his behavior and states he does want help, he's "just so sleepy.  If I could talk to them around lunch time that would be better."

## 2017-01-08 NOTE — ED Notes (Signed)
Pt resting in bed quietly, eating snacks, sitter at bedside

## 2017-01-08 NOTE — ED Notes (Addendum)
Pt now yelling and cursing.  Pt is refusing to speak with anyone.  States he will never return here for help again.

## 2017-01-08 NOTE — ED Notes (Signed)
Pt taken to shower with sitter, given sprite.  Sheets changed and new scrubs given too.
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
## 2017-01-08 NOTE — Progress Notes (Signed)
[facility] ED Nurse, contacted CSW and related that John Gibson was now willing to talk to someone in the TTS office in order to complete an assessment.  CSW conferred with TTS team and it was determined that since pt had already been assessed by Dr. Ozella Rocks, no further assessment is required.  CSW call [name] and spoke with pt directly via speaker phone, at the request of his nurse.  [name] Nurse, Toni Amend, was present in the room.  John Gibson initially to have the placement process explained to him.  He then asked that he be placed "near my boyfriend who lives in [place].. CSW explained that hospital treatment /placement referrals were only sent to hospitals in the state of [place],so it would not be possible to refer the pt to a hospital in [place].   John Gibson then asked to be placed "somewhere they do DBT". CSW, stated there was no way for the writer to know that, and that the purpose of any psychiatric placement was hospitalization to stabilize medications and reduce or eliminate symptoms.  CSW noted that most people are hospitalized 5-7 days.    At this point John Gibson began to tell this writer the merits and limitations of several psychiatric treatment facilities. John Gibson indicated that he had been inpatient at [facility] but didn't like that hospital, "because you can't make long distance call.", [facility] and [facility]. He indicated that he preferred [facility].  CSW told patient that there was no guarantee of a bed at a particular facility but that I would fax his information out to [facility] and other hospitals.  Pt. Indicated that he understood the process.  CSW reassured patient that he would be informed of any treatment offers. John Gibson. John Gibson, MSW, LCSWA Clinical Social Work Disposition [phone]

## 2017-01-08 NOTE — Progress Notes (Signed)
Pt assessed and inpatient treatment is recommended.  Referrals sent as follows:  [place],  [place],  [facility] [facility],  [place],  [facility], [facility],  [facility], [facility]  [place],  [place],  [facility],  [place] T. [name], MSW, LCSWA Clinical Social Work Disposition [phone]

## 2017-01-08 NOTE — ED Notes (Signed)
Pt became agitated, yelling obscenities and threatening to leave.  Attempted TTS however could not be completed b/c pt would not cooperate.  Pt yelled thoughts of wanting to end his life many times," I am so tired of this shit, I just want it to be over..... I just want to end my miserable life.... I just want it to be over."   RN informed provider who ordered medications.  With security standing by, pt was compliant w/ direction of RN.  Medication was administered w/o incident.  He immediately stated in a calm manor, "that is all I needed." Pt began to talk stating "I need long term treatment..... The hospitals get me to a good place but I don't have any coping skills to stay there...  I need help and can't get it so I just need to end it all."  Pt thanked RN for the medication and agreed to lay down and shortly fell asleep.

## 2017-01-08 NOTE — ED Notes (Signed)
[employer] served pt w/ IVC paperwork
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
## 2017-01-08 NOTE — ED Notes (Signed)
Pt remains asleep, asked not to be woken up for meals stated that that is what caused his last outburst.  Patient not woken up for snack time per request. Sitter at bedside, dinner ordered. Will continue to monitor

## 2017-01-08 NOTE — ED Notes (Signed)
Pt is currently sleeping, per previous shift RN he requested not to be woken up for anything, he was woken up earlier for a tts and became very aggressive, will let patient sleep.

## 2017-01-08 NOTE — ED Notes (Signed)
Pts boyfriend would like to be contacted  Casimiro Needle [phone] would like to be contacted with any updates.

## 2017-01-08 NOTE — [facility] Counselor (Signed)
Writer attempted to see Pt. Pt refuses TTS assessment. Pt states he would like to be D/C. Pt states "I'm done talking permanently."   Wolfgang Phoenix, [facility] Triage Specialist

## 2017-01-08 NOTE — ED Notes (Signed)
Pt has laid back down and turned the lights back out with his back to the door.  Pt continues to state he will not talk with anyone else.

## 2017-01-08 NOTE — ED Notes (Signed)
Pt sleeping, no apparent distress at this time.

## 2017-01-08 NOTE — ED Notes (Addendum)
Pt came out of room and very politely asked to use the phone, he states that now that he has caught up on his sleep he feels better, he would like to see tts again, tts contacted. Dinner at bedside

## 2017-01-08 NOTE — ED Notes (Signed)
Gene from [facility] talked with Minerva Areola on the phone about the process, he is very apologetic about his behavior last night and just wants help with treatment, he seems very appreciative for everything we have done and for Gene taking the time to explain it better to him.

## 2017-01-09 DIAGNOSIS — F4310 Post-traumatic stress disorder, unspecified: Secondary | ICD-10-CM

## 2017-01-09 DIAGNOSIS — F1290 Cannabis use, unspecified, uncomplicated: Secondary | ICD-10-CM | POA: Diagnosis not present

## 2017-01-09 DIAGNOSIS — F17210 Nicotine dependence, cigarettes, uncomplicated: Secondary | ICD-10-CM | POA: Diagnosis not present

## 2017-01-09 DIAGNOSIS — R45851 Suicidal ideations: Secondary | ICD-10-CM | POA: Insufficient documentation

## 2017-01-09 MED ORDER — OLANZAPINE 15 MG PO TABS: 15.0000 mg | ORAL_TABLET | Freq: Every day | ORAL | 0 refills | Status: DC

## 2017-01-09 MED ORDER — BENZTROPINE MESYLATE 1 MG PO TABS: 1.0000 mg | ORAL_TABLET | Freq: Every day | ORAL | 0 refills | Status: DC

## 2017-01-09 MED ORDER — TRAZODONE HCL 50 MG PO TABS: 50.0000 mg | ORAL_TABLET | Freq: Every evening | ORAL | 0 refills | Status: DC | PRN

## 2017-01-09 MED ORDER — GABAPENTIN 300 MG PO CAPS: 300.0000 mg | ORAL_CAPSULE | Freq: Two times a day (BID) | ORAL | 0 refills | Status: DC

## 2017-01-09 MED ORDER — DULOXETINE HCL 30 MG PO CPEP: 30.0000 mg | ORAL_CAPSULE | Freq: Every day | ORAL | 0 refills | Status: DC

## 2017-01-09 NOTE — ED Notes (Signed)
Drink given -- [facility] called to re-assess pt, he refused to talk with consular. -- lunch tray ordered at 1010

## 2017-01-09 NOTE — [facility] Counselor (Signed)
Re-assessment. Pt denies SI/HI and AVH.  Pt [place] and [place] outpatient therapy.  Pt also re-assessed by Jacki ConesLaurie, NP. Jacki ConesLaurie, NP recommends D/C.  John Gibson, [facility] Triage Specialist

## 2017-01-09 NOTE — ED Notes (Signed)
[facility]

## 2017-01-09 NOTE — ED Provider Notes (Signed)
Patient seen and evaluated by the psychiatric team and deemed to be safe for discharge home.  Psychiatric team is requesting reversal of IVC paperwork.  This paperwork was filled out by me for convenience.  From a medical standpoint the patient is safe for discharge home.  Please see consultation note for complete details.  Psychiatric disposition determined by the psychiatric team   Azalia BilisKevin Leslye Puccini, MD 01/09/17 1104

## 2017-01-09 NOTE — ED Notes (Signed)
Pt making 5 min phone call  
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
## 2017-01-10 ENCOUNTER — Emergency Department (HOSPITAL_COMMUNITY)
Admission: EM | Admit: 2017-01-10 | Discharge: 2017-01-10 | Disposition: A | Discharge status: Home / Self Care | Payer: Medicaid Other | Attending: Emergency Medicine | Admitting: Emergency Medicine

## 2017-01-10 ENCOUNTER — Encounter (HOSPITAL_COMMUNITY): Payer: Self-pay | Admitting: Emergency Medicine

## 2017-01-10 DIAGNOSIS — M79661 Pain in right lower leg: Secondary | ICD-10-CM | POA: Diagnosis present

## 2017-01-10 DIAGNOSIS — Z5321 Procedure and treatment not carried out due to patient leaving prior to being seen by health care provider: Secondary | ICD-10-CM | POA: Diagnosis not present

## 2017-01-10 NOTE — ED Notes (Signed)
Pt left without discharged

## 2017-01-10 NOTE — ED Notes (Signed)
Pt states he drinks daily ( want treatment)

## 2017-01-10 NOTE — ED Triage Notes (Addendum)
Pt here via EMS with complaints of bilateral shin pain. Pt has hx of arthritis and told EMS he was walking and his knees started to hurt 8/10. Pt is ambulatory without difficulty. Pt states he drank 1 beer to help relieve the pain.

## 2017-01-13 ENCOUNTER — Emergency Department (HOSPITAL_COMMUNITY)
Admission: EM | Admit: 2017-01-13 | Discharge: 2017-01-13 | Disposition: A | Discharge status: Other Acute Inpatient Hospital | Payer: Medicaid Other | Attending: Emergency Medicine | Admitting: Emergency Medicine

## 2017-01-13 ENCOUNTER — Encounter (HOSPITAL_COMMUNITY): Payer: Self-pay

## 2017-01-13 ENCOUNTER — Inpatient Hospital Stay (HOSPITAL_COMMUNITY)
Admission: AD | Admit: 2017-01-13 | Discharge: 2017-01-20 | DRG: 885 | Disposition: A | Discharge status: Home / Self Care | Payer: Medicaid Other | Attending: Psychiatry | Admitting: Psychiatry

## 2017-01-13 ENCOUNTER — Encounter (HOSPITAL_COMMUNITY): Payer: Self-pay | Admitting: Emergency Medicine

## 2017-01-13 DIAGNOSIS — Y939 Activity, unspecified: Secondary | ICD-10-CM | POA: Diagnosis not present

## 2017-01-13 DIAGNOSIS — F411 Generalized anxiety disorder: Secondary | ICD-10-CM | POA: Diagnosis present

## 2017-01-13 DIAGNOSIS — IMO0002 Reserved for concepts with insufficient information to code with codable children: Secondary | ICD-10-CM

## 2017-01-13 DIAGNOSIS — F250 Schizoaffective disorder, bipolar type: Principal | ICD-10-CM | POA: Diagnosis present

## 2017-01-13 DIAGNOSIS — R4587 Impulsiveness: Secondary | ICD-10-CM | POA: Diagnosis present

## 2017-01-13 DIAGNOSIS — S59912A Unspecified injury of left forearm, initial encounter: Secondary | ICD-10-CM | POA: Diagnosis present

## 2017-01-13 DIAGNOSIS — S50812A Abrasion of left forearm, initial encounter: Secondary | ICD-10-CM | POA: Insufficient documentation

## 2017-01-13 DIAGNOSIS — F845 Asperger's syndrome: Secondary | ICD-10-CM | POA: Diagnosis not present

## 2017-01-13 DIAGNOSIS — F329 Major depressive disorder, single episode, unspecified: Secondary | ICD-10-CM

## 2017-01-13 DIAGNOSIS — R4183 Borderline intellectual functioning: Secondary | ICD-10-CM | POA: Diagnosis not present

## 2017-01-13 DIAGNOSIS — Y929 Unspecified place or not applicable: Secondary | ICD-10-CM | POA: Insufficient documentation

## 2017-01-13 DIAGNOSIS — Z7289 Other problems related to lifestyle: Secondary | ICD-10-CM

## 2017-01-13 DIAGNOSIS — F909 Attention-deficit hyperactivity disorder, unspecified type: Secondary | ICD-10-CM | POA: Diagnosis present

## 2017-01-13 DIAGNOSIS — F79 Unspecified intellectual disabilities: Secondary | ICD-10-CM | POA: Diagnosis present

## 2017-01-13 DIAGNOSIS — Z888 Allergy status to other drugs, medicaments and biological substances status: Secondary | ICD-10-CM

## 2017-01-13 DIAGNOSIS — Z590 Homelessness: Secondary | ICD-10-CM | POA: Diagnosis not present

## 2017-01-13 DIAGNOSIS — J45909 Unspecified asthma, uncomplicated: Secondary | ICD-10-CM | POA: Diagnosis not present

## 2017-01-13 DIAGNOSIS — R45851 Suicidal ideations: Secondary | ICD-10-CM | POA: Diagnosis not present

## 2017-01-13 DIAGNOSIS — F17210 Nicotine dependence, cigarettes, uncomplicated: Secondary | ICD-10-CM | POA: Diagnosis present

## 2017-01-13 DIAGNOSIS — X780XXA Intentional self-harm by sharp glass, initial encounter: Secondary | ICD-10-CM | POA: Insufficient documentation

## 2017-01-13 DIAGNOSIS — Y999 Unspecified external cause status: Secondary | ICD-10-CM | POA: Insufficient documentation

## 2017-01-13 DIAGNOSIS — I10 Essential (primary) hypertension: Secondary | ICD-10-CM | POA: Diagnosis not present

## 2017-01-13 DIAGNOSIS — G40909 Epilepsy, unspecified, not intractable, without status epilepticus: Secondary | ICD-10-CM | POA: Diagnosis present

## 2017-01-13 DIAGNOSIS — Z79899 Other long term (current) drug therapy: Secondary | ICD-10-CM | POA: Diagnosis not present

## 2017-01-13 DIAGNOSIS — F322 Major depressive disorder, single episode, severe without psychotic features: Secondary | ICD-10-CM | POA: Diagnosis present

## 2017-01-13 DIAGNOSIS — F1290 Cannabis use, unspecified, uncomplicated: Secondary | ICD-10-CM | POA: Diagnosis not present

## 2017-01-13 DIAGNOSIS — F32A Depression, unspecified: Secondary | ICD-10-CM

## 2017-01-13 DIAGNOSIS — Z91410 Personal history of adult physical and sexual abuse: Secondary | ICD-10-CM

## 2017-01-13 HISTORY — DX: Unspecified hemorrhoids: K64.9

## 2017-01-13 HISTORY — DX: Unspecified convulsions: R56.9

## 2017-01-13 LAB — RAPID URINE DRUG SCREEN, HOSP PERFORMED
Amphetamines: NOT DETECTED
BARBITURATES: NOT DETECTED
Benzodiazepines: NOT DETECTED
COCAINE: NOT DETECTED
OPIATES: NOT DETECTED
Tetrahydrocannabinol: POSITIVE — AB

## 2017-01-13 LAB — ACETAMINOPHEN LEVEL: Acetaminophen (Tylenol), Serum: <10 ug/mL — ABNORMAL LOW (ref 10–30)

## 2017-01-13 LAB — COMPREHENSIVE METABOLIC PANEL
ALK PHOS: 77 U/L (ref 38–126)
ALT: 31 U/L (ref 17–63)
AST: 27 U/L (ref 15–41)
Albumin: 4.1 g/dL (ref 3.5–5.0)
Anion gap: 8 (ref 5–15)
BILIRUBIN TOTAL: 0.2 mg/dL — AB (ref 0.3–1.2)
BUN: 8 mg/dL (ref 6–20)
CALCIUM: 9 mg/dL (ref 8.9–10.3)
CO2: 22 mmol/L (ref 22–32)
Chloride: 109 mmol/L (ref 101–111)
Creatinine, Ser: 0.85 mg/dL (ref 0.61–1.24)
Glucose, Bld: 96 mg/dL (ref 65–99)
POTASSIUM: 3.5 mmol/L (ref 3.5–5.1)
Sodium: 139 mmol/L (ref 135–145)
TOTAL PROTEIN: 7.5 g/dL (ref 6.5–8.1)

## 2017-01-13 LAB — SALICYLATE LEVEL

## 2017-01-13 LAB — URINALYSIS, ROUTINE W REFLEX MICROSCOPIC
BILIRUBIN URINE: NEGATIVE
Glucose, UA: NEGATIVE mg/dL
Hgb urine dipstick: NEGATIVE
KETONES UR: 5 mg/dL — AB
LEUKOCYTES UA: NEGATIVE
NITRITE: NEGATIVE
PH: 5 (ref 5.0–8.0)
Protein, ur: NEGATIVE mg/dL
Specific Gravity, Urine: 1.027 (ref 1.005–1.030)

## 2017-01-13 LAB — CBC
HEMATOCRIT: 41.5 % (ref 39.0–52.0)
Hemoglobin: 14.1 g/dL (ref 13.0–17.0)
MCH: 30.5 pg (ref 26.0–34.0)
MCHC: 34 g/dL (ref 30.0–36.0)
MCV: 89.8 fL (ref 78.0–100.0)
PLATELETS: 312 10*3/uL (ref 150–400)
RBC: 4.62 MIL/uL (ref 4.22–5.81)
RDW: 13.6 % (ref 11.5–15.5)
WBC: 10.5 10*3/uL (ref 4.0–10.5)

## 2017-01-13 LAB — ETHANOL

## 2017-01-13 LAB — LIPASE, BLOOD: Lipase: 23 U/L (ref 11–51)

## 2017-01-13 MED ORDER — MAGNESIUM HYDROXIDE 400 MG/5ML PO SUSP
30.0000 mL | Freq: Every day | ORAL | Status: DC | PRN
  Administered 2017-01-18: 30 mL via ORAL
  Filled 2017-01-13: qty 30

## 2017-01-13 MED ORDER — ONDANSETRON HCL 4 MG PO TABS: 4.0000 mg | ORAL_TABLET | Freq: Three times a day (TID) | ORAL | Status: DC | PRN

## 2017-01-13 MED ORDER — IBUPROFEN 200 MG PO TABS: 600.0000 mg | ORAL_TABLET | Freq: Three times a day (TID) | ORAL | Status: DC | PRN

## 2017-01-13 MED ORDER — IBUPROFEN 600 MG PO TABS
600.0000 mg | ORAL_TABLET | Freq: Three times a day (TID) | ORAL | Status: DC | PRN
  Administered 2017-01-18: 600 mg via ORAL
  Filled 2017-01-13: qty 1

## 2017-01-13 MED ORDER — HYDROXYZINE HCL 50 MG PO TABS
50.0000 mg | ORAL_TABLET | Freq: Four times a day (QID) | ORAL | Status: DC | PRN
  Administered 2017-01-19 (×2): 50 mg via ORAL
  Filled 2017-01-13: qty 10
  Filled 2017-01-13: qty 1
  Filled 2017-01-13: qty 10
  Filled 2017-01-13: qty 1

## 2017-01-13 MED ORDER — ALUM & MAG HYDROXIDE-SIMETH 200-200-20 MG/5ML PO SUSP
30.0000 mL | ORAL | Status: DC | PRN
  Administered 2017-01-15 – 2017-01-19 (×3): 30 mL via ORAL
  Filled 2017-01-13 (×3): qty 30

## 2017-01-13 MED ORDER — ACETAMINOPHEN 325 MG PO TABS: 650.0000 mg | ORAL_TABLET | ORAL | Status: DC | PRN

## 2017-01-13 MED ORDER — DULOXETINE HCL 30 MG PO CPEP
30.0000 mg | ORAL_CAPSULE | Freq: Every day | ORAL | Status: DC
  Administered 2017-01-13: 30 mg via ORAL
  Filled 2017-01-13: qty 1

## 2017-01-13 MED ORDER — OLANZAPINE 7.5 MG PO TABS
15.0000 mg | ORAL_TABLET | Freq: Every day | ORAL | Status: DC
  Administered 2017-01-13: 15 mg via ORAL
  Filled 2017-01-13: qty 6
  Filled 2017-01-13 (×3): qty 2

## 2017-01-13 MED ORDER — HYDROXYZINE HCL 25 MG PO TABS: 50.0000 mg | ORAL_TABLET | Freq: Four times a day (QID) | ORAL | Status: DC | PRN

## 2017-01-13 MED ORDER — BENZTROPINE MESYLATE 1 MG PO TABS
1.0000 mg | ORAL_TABLET | Freq: Every day | ORAL | Status: DC
  Administered 2017-01-14: 1 mg via ORAL
  Filled 2017-01-13 (×3): qty 1

## 2017-01-13 MED ORDER — NICOTINE 21 MG/24HR TD PT24
21.0000 mg | MEDICATED_PATCH | Freq: Every day | TRANSDERMAL | Status: DC
  Filled 2017-01-13: qty 1

## 2017-01-13 MED ORDER — OLANZAPINE 5 MG PO TABS: 15.0000 mg | ORAL_TABLET | Freq: Every day | ORAL | Status: DC

## 2017-01-13 MED ORDER — NICOTINE POLACRILEX 2 MG MT GUM
2.0000 mg | CHEWING_GUM | OROMUCOSAL | Status: DC | PRN
  Administered 2017-01-16 – 2017-01-19 (×14): 2 mg via ORAL
  Filled 2017-01-13 (×2): qty 1
  Filled 2017-01-13: qty 10
  Filled 2017-01-13 (×4): qty 1

## 2017-01-13 MED ORDER — BENZTROPINE MESYLATE 1 MG PO TABS
1.0000 mg | ORAL_TABLET | Freq: Every day | ORAL | Status: DC
  Administered 2017-01-13: 1 mg via ORAL
  Filled 2017-01-13: qty 1

## 2017-01-13 MED ORDER — HYDROXYZINE HCL 25 MG PO TABS
50.0000 mg | ORAL_TABLET | Freq: Once | ORAL | Status: AC
  Administered 2017-01-13: 50 mg via ORAL
  Filled 2017-01-13: qty 2

## 2017-01-13 MED ORDER — TRAZODONE HCL 50 MG PO TABS: 50.0000 mg | ORAL_TABLET | Freq: Every evening | ORAL | Status: DC | PRN

## 2017-01-13 MED ORDER — PNEUMOCOCCAL VAC POLYVALENT 25 MCG/0.5ML IJ INJ: 0.5000 mL | INJECTION | INTRAMUSCULAR | Status: DC

## 2017-01-13 MED ORDER — DULOXETINE HCL 30 MG PO CPEP
30.0000 mg | ORAL_CAPSULE | Freq: Every day | ORAL | Status: DC
  Administered 2017-01-14 – 2017-01-15 (×2): 30 mg via ORAL
  Filled 2017-01-13 (×4): qty 1

## 2017-01-13 MED ORDER — ALUM & MAG HYDROXIDE-SIMETH 200-200-20 MG/5ML PO SUSP: 30.0000 mL | ORAL | Status: DC | PRN

## 2017-01-13 MED ORDER — TRAZODONE HCL 50 MG PO TABS
50.0000 mg | ORAL_TABLET | Freq: Every evening | ORAL | Status: DC | PRN
  Administered 2017-01-15 – 2017-01-19 (×5): 50 mg via ORAL
  Filled 2017-01-13: qty 1
  Filled 2017-01-13: qty 7
  Filled 2017-01-13 (×5): qty 1

## 2017-01-13 NOTE — ED Notes (Signed)
Pt reports he has had multiple episodes of blood in his stool.  Last episode last Wednesday.  States when it happens sometimes it "pours" out.  Also states he has intermittent abdominal cramping he would like to get checked out. Family history of Crohn's and Ulcerative Colitis per family via phone.

## 2017-01-13 NOTE — Progress Notes (Signed)
Patient ID: John Gibson, male   DOB: 20-Dec-1990, 26 y.o.   MRN: [id number]  Pt currently presents with a flat affect and isolative behavior. Pt reports to writer that their goal is to "rest." Pt remains in room except to get some snack tonight. Pt reports poor sleep at home, requests medication. Pt main complaint is diarrhea, reports intermittent chronic but is unable to report due to what. Review of history shows patient has had IBS exacerbations and hemorrhoids in past.   Pt provided with medications per providers orders. Pt's labs and vitals were monitored throughout the night. Pt given a 1:1 about emotional and mental status. Pt supported and encouraged to express concerns and questions. Pt educated on medications and healthy stool characteristics. Provider notified of patients current complaint, see orders.   Pt's safety ensured with 15 minute and environmental checks. Pt endorses passive SI, plan to "find something if I wasn't in a place like this." Pt currently denies HI and A/V hallucinations. Pt verbally agrees to seek staff if SI worsens, HI or A/VH occurs and to consult with staff before acting on any harmful thoughts. Will continue POC.

## 2017-01-13 NOTE — BH Assessment (Signed)
Patient continues to meet criteria for Inpatient treatment, per Dr. Jannifer FranklinAkintayo and Nanine MeansJamison Lord, DNP. Per [facility] patient will have a bed available later today [id number].  Lillia AbedLindsay will call when bed is ready. Support paperwork has been completed.  Nursing report [phone].  [facility] [place]

## 2017-01-13 NOTE — Progress Notes (Signed)
Psychoeducational Group Note  Date:  01/13/2017 Time:  2242  Group Topic/Focus:  Wrap-Up Group:   The focus of this group is to help patients review their daily goal of treatment and discuss progress on daily workbooks.  Participation Level: Did Not Attend  Participation Quality:  Not Applicable  Affect:  Not Applicable  Cognitive:  Not Applicable  Insight:  Not Applicable  Engagement in Group: Not Applicable  Additional Comments:  The patient did not attend group this evening.  Hazle CocaGOODMAN, Garfield Coiner S 01/13/2017, 10:42 PM

## 2017-01-13 NOTE — ED Triage Notes (Signed)
Pt comes in with complaints of being suicidal after an argument with his significant other. Pt states he feels like cutting himself and that he cut himself earlier today.  Old scars noted to wrist. Pt ambulatory and A&O x4.  Pt comes voluntarily with [employer].
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
## 2017-01-13 NOTE — Progress Notes (Signed)
01/13/17 1347:  LRT went to pt room to offer activities, pt was sleep.  Keniesha Adderly, LRT/CTRS 

## 2017-01-13 NOTE — ED Notes (Signed)
Pt. Transferred to [facility] from ED to room after screening for contraband. Report to include Situation, Background, Assessment and [facility]. Pt. Oriented to unit including Q15 minute rounds as well as the security cameras for their protection. Patient is alert and oriented, warm and dry in no acute distress. Patient denies HI, and AVH. Pt. States he has SI with plan to cut wrists. Pt. Encouraged to let me know if needs arise.

## 2017-01-13 NOTE — ED Notes (Signed)
Patient left room during assessment with provider stating he was leaving and grabbed his belongings.  Dr. Lynelle DoctorKnapp advised this RN and off duty [employer] she is going to fill out IVC paperwork for patient.  Patient continued to go into lobby.  Security and off duty [employer] at patient side.

## 2017-01-13 NOTE — ED Notes (Signed)
Patient remains safe on unit.  Spent most of day in bed, eating meals.  Either resting or watching TV throughout day.  Denies SI HI and AVH.  [facility] via [employer] on involuntary commitment, condition stable on discharge.  Report called to [facility], RN.  Belongings sent with [employer] along with IVC paperwork and EMTALA form.

## 2017-01-13 NOTE — ED Notes (Signed)
Hourly rounding reveals patient sleeping in room. No complaints, stable, in no acute distress. Q15 minute rounds and monitoring via Security Cameras to continue. 

## 2017-01-13 NOTE — ED Notes (Signed)
Pt placed into paper scrubs.  Belongings placed into patient belonging's bag.  Pt on his cell phone currently with his cousin.  Cousin supportive in care and encouraging patient to be seen by psychiatrist.
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
## 2017-01-13 NOTE — Tx Team (Signed)
Initial Treatment Plan 01/13/2017 5:29 PM John AgeeEric Rundell ZOX:[id number]    PATIENT STRESSORS: Financial difficulties Health problems Marital or family conflict Medication change or noncompliance Substance abuse Traumatic event   PATIENT STRENGTHS: Capable of independent living General fund of knowledge   PATIENT IDENTIFIED PROBLEMS: Depression  Anxiety  Suicidal ideation  "get stable"  "learn how to have a positive relationship and have healthy boundaries"  "learn coping skills           DISCHARGE CRITERIA:  Improved stabilization in mood, thinking, and/or behavior Motivation to continue treatment in a less acute level of care Verbal commitment to aftercare and medication compliance  PRELIMINARY DISCHARGE PLAN: Outpatient therapy Medication management  PATIENT/FAMILY INVOLVEMENT: This treatment plan has been presented to and reviewed with the patient, John Ageeric Mau.  The patient and family have been given the opportunity to ask questions and make suggestions.  Levin BaconHeather V Mittie Knittel, RN 01/13/2017, 5:29 PM

## 2017-01-13 NOTE — BH Assessment (Signed)
Patient continues to meet criteria for Inpatient treatment, per Dr. Jannifer FranklinAkintayo and Nanine MeansJamison Lord, DNP. [facility] [address] [address], [address] [facility], [place], and [facility].

## 2017-01-13 NOTE — ED Notes (Signed)
Bed: WTR5 Expected date:  Expected time:  Means of arrival:  Comments: 

## 2017-01-13 NOTE — ED Notes (Signed)
ED Provider at bedside. 

## 2017-01-13 NOTE — Progress Notes (Signed)
John Gibson is a 26 year old male being admitted involuntarily to [facility] from [facility].  He came to the ED with suicidal ideation and plan to cut wrists.  Prior to hospital admit, he had argument with significant other and had broken bottle to cut self.  He has history of Schizoaffective Disorder and Cannabis use disorder.    He reported having history of asthma, GERD, seizures and arthritis.  He denies any pain or discomfort and appears to be in no physical distress.  He reported that he is homeless and is wanting "long term psychiatric treatment."  Oriented him to the unit.  Admission paperwork completed and signed.  Belongings searched and secured in locker # 33.  Skin assessment completed and noted tattoos on left posterior leg, left/right deltoid, left forearm, right hand, right/left leg and left foot as well as three small abrasions to left leg (healing) .  Q 15 minute checks initiated for safety.  We will monitor the progress towards his goals.

## 2017-01-13 NOTE — ED Provider Notes (Signed)
[facility]-EMERGENCY DEPT Provider Note   CSN: [id number] Arrival date & time: 01/13/17  0206  By signing my name below, I, Cynda Acres, attest that this documentation has been prepared under the direction and in the presence of Devoria Albe, MD. Electronically Signed: Cynda Acres, Scribe. 01/13/17. 3:07 AM.  Time seen 02:57 AM  History   Chief Complaint Depressed Suicidal  Level V caveat for psychiatric illness  HPI Comments: John Gibson is a 26 y.o. male with a history of anxiety, PTSD, and schitzophrenia, who presents to the Emergency Department complaining of sudden-onset suicidal ideations that began earlier tonight. According to the nurse the patient was in an argument with his significant other and began feeling suicidal. Patient states every time he asks for help nobody helps. He repeats this many times, but cannot tell what he needs help with or what problem he is having.  Patient states he has been seeking long term treatment because he is "insane". Patient states Patient states he is beyond depressed and has been for a while now. Patient states he broke a glass bottle in order to cut himself on the left arm. Patient states "give me a scalpel and I'll finish the job". Patient then refuses to answer any more questions and states "I plea to fifth" and states "I want my things and I want to leave". Patient gets up and walks out of the door, he grabbed his bags behind the nurses station and left.    The history is provided by the patient. No language interpreter was used.   PCP none  Past Medical History:  Diagnosis Date  . Anxiety   . Arthritis   . Asthma   . GERD (gastroesophageal reflux disease)    IBS  . Hemorrhoids   . IBS (irritable bowel syndrome)   . PTSD (post-traumatic stress disorder)   . Schizoaffective disorder [facility])     Patient Active Problem List   Diagnosis Date Noted  . Suicidal thoughts   . Asthma exacerbation 12/28/2012  . Hypokalemia 12/28/2012  .  Schizoaffective disorder, bipolar type (HCC) 12/28/2012  . Tobacco abuse 12/28/2012    Past Surgical History:  Procedure Laterality Date  . arm surgery    . extraction of wisdom teeth    . MOUTH SURGERY         Home Medications    Prior to Admission medications   Medication Sig Start Date End Date Taking? Authorizing Provider  benztropine (COGENTIN) 1 MG tablet Take 1 tablet (1 mg total) by mouth daily. Patient not taking: Reported on 01/13/2017 01/09/17   Azalia Bilis, MD  DULoxetine (CYMBALTA) 30 MG capsule Take 1 capsule (30 mg total) by mouth daily. Patient not taking: Reported on 01/13/2017 01/09/17   Azalia Bilis, MD  gabapentin (NEURONTIN) 300 MG capsule Take 1 capsule (300 mg total) by mouth 2 (two) times daily. Patient not taking: Reported on 01/13/2017 01/09/17   Azalia Bilis, MD  OLANZapine (ZYPREXA) 15 MG tablet Take 1 tablet (15 mg total) by mouth at bedtime. Patient not taking: Reported on 01/13/2017 01/09/17   Azalia Bilis, MD  traZODone (DESYREL) 50 MG tablet Take 1 tablet (50 mg total) by mouth at bedtime as needed for sleep. Patient not taking: Reported on 01/13/2017 01/09/17   Azalia Bilis, MD    Family History Family History  Problem Relation Age of Onset  . Family history unknown: Yes    Social History Social History  Substance Use Topics  . Smoking status: Current Every Day Smoker  Packs/day: 2.00    Types: Cigarettes  . Smokeless tobacco: Never Used  . Alcohol use Yes     Comment: Occasionally     Allergies   Buspirone and Divalproex sodium   Review of Systems Review of Systems  Unable to perform ROS: Psychiatric disorder  Psychiatric/Behavioral: Positive for self-injury and suicidal ideas. Negative for hallucinations.  All other systems reviewed and are negative.    Physical Exam Updated Vital Signs BP 125/78 (BP Location: Right Arm)   Pulse (!) 104   Temp 98.8 F (37.1 C) (Oral)   Resp 16   Ht 5\' 8"  (1.727 m)   Wt 240 lb (108.9 kg)    SpO2 100%   BMI 36.49 kg/m   Vital signs normal Except for tachycardia   Physical Exam  Constitutional: He is oriented to person, place, and time. He appears well-developed and well-nourished.  Non-toxic appearance. He does not appear ill. No distress.  HENT:  Head: Normocephalic and atraumatic.  Right Ear: External ear normal.  Left Ear: External ear normal.  Nose: Nose normal. No mucosal edema or rhinorrhea.  Mouth/Throat: Mucous membranes are normal. No dental abscesses or uvula swelling.  Eyes: Conjunctivae and EOM are normal.  Neck: Normal range of motion and full passive range of motion without pain.  Cardiovascular: Normal rate.   Pulmonary/Chest: Effort normal. No respiratory distress. He has no rhonchi. He exhibits no crepitus.  Abdominal: Normal appearance.  Musculoskeletal: Normal range of motion. He exhibits no edema or tenderness.  Moves all extremities well.   Neurological: He is alert and oriented to person, place, and time. He has normal strength. No cranial nerve deficit.  Skin: Skin is warm, dry and intact. No rash noted. No erythema. No pallor.  Patient has linear superficial abrasions to the left forearm.   Psychiatric: His affect is angry and labile. His speech is rapid and/or pressured and delayed. He is agitated and aggressive. He is not actively hallucinating. He expresses suicidal ideation. He expresses no homicidal ideation. He expresses suicidal plans. He expresses no homicidal plans.  Nursing note and vitals reviewed.      ED Treatments / Results   Results for orders placed or performed during the hospital encounter of 01/13/17  Comprehensive metabolic panel  Result Value Ref Range   Sodium 139 135 - 145 mmol/L   Potassium 3.5 3.5 - 5.1 mmol/L   Chloride 109 101 - 111 mmol/L   CO2 22 22 - 32 mmol/L   Glucose, Bld 96 65 - 99 mg/dL   BUN 8 6 - 20 mg/dL   Creatinine, Ser 4.090.85 0.61 - 1.24 mg/dL   Calcium 9.0 8.9 - 81.110.3 mg/dL   Total Protein 7.5 6.5  - 8.1 g/dL   Albumin 4.1 3.5 - 5.0 g/dL   AST 27 15 - 41 U/L   ALT 31 17 - 63 U/L   Alkaline Phosphatase 77 38 - 126 U/L   Total Bilirubin 0.2 (L) 0.3 - 1.2 mg/dL   GFR calc non Af Amer >60 >60 mL/min   GFR calc Af Amer >60 >60 mL/min   Anion gap 8 5 - 15  Ethanol  Result Value Ref Range   Alcohol, Ethyl (B) <5 <5 mg/dL  Salicylate level  Result Value Ref Range   Salicylate Lvl <7.0 2.8 - 30.0 mg/dL  Acetaminophen level  Result Value Ref Range   Acetaminophen (Tylenol), Serum <10 (L) 10 - 30 ug/mL  cbc  Result Value Ref Range   WBC 10.5  4.0 - 10.5 K/uL   RBC 4.62 4.22 - 5.81 MIL/uL   Hemoglobin 14.1 13.0 - 17.0 g/dL   HCT 16.1 09.6 - 04.5 %   MCV 89.8 78.0 - 100.0 fL   MCH 30.5 26.0 - 34.0 pg   MCHC 34.0 30.0 - 36.0 g/dL   RDW 40.9 81.1 - 91.4 %   Platelets 312 150 - 400 K/uL  Rapid urine drug screen (hospital performed)  Result Value Ref Range   Opiates NONE DETECTED NONE DETECTED   Cocaine NONE DETECTED NONE DETECTED   Benzodiazepines NONE DETECTED NONE DETECTED   Amphetamines NONE DETECTED NONE DETECTED   Tetrahydrocannabinol POSITIVE (A) NONE DETECTED   Barbiturates NONE DETECTED NONE DETECTED  Lipase, blood  Result Value Ref Range   Lipase 23 11 - 51 U/L  Urinalysis, Routine w reflex microscopic  Result Value Ref Range   Color, Urine YELLOW YELLOW   APPearance HAZY (A) CLEAR   Specific Gravity, Urine 1.027 1.005 - 1.030   pH 5.0 5.0 - 8.0   Glucose, UA NEGATIVE NEGATIVE mg/dL   Hgb urine dipstick NEGATIVE NEGATIVE   Bilirubin Urine NEGATIVE NEGATIVE   Ketones, ur 5 (A) NEGATIVE mg/dL   Protein, ur NEGATIVE NEGATIVE mg/dL   Nitrite NEGATIVE NEGATIVE   Leukocytes, UA NEGATIVE NEGATIVE   Laboratory interpretation all normal except +UDS   EKG  EKG Interpretation None       Radiology No results found.  Procedures Procedures (including critical care time)  Medications Ordered in ED Medications  acetaminophen (TYLENOL) tablet 650 mg (not  administered)  ibuprofen (ADVIL,MOTRIN) tablet 600 mg (not administered)  nicotine (NICODERM CQ - dosed in mg/24 hours) patch 21 mg (not administered)  ondansetron (ZOFRAN) tablet 4 mg (not administered)  alum & mag hydroxide-simeth (MAALOX/MYLANTA) 200-200-20 MG/5ML suspension 30 mL (not administered)  benztropine (COGENTIN) tablet 1 mg (not administered)  DULoxetine (CYMBALTA) DR capsule 30 mg (not administered)  OLANZapine (ZYPREXA) tablet 15 mg (not administered)  traZODone (DESYREL) tablet 50 mg (not administered)  hydrOXYzine (ATARAX/VISTARIL) tablet 50 mg (not administered)  hydrOXYzine (ATARAX/VISTARIL) tablet 50 mg (50 mg Oral Given 01/13/17 0412)     Initial Impression / Assessment and Plan / ED Course  I have reviewed the triage vital signs and the nursing notes.  Pertinent labs & imaging results that were available during my care of the patient were reviewed by me and considered in my medical decision making (see chart for details).      DIAGNOSTIC STUDIES: Oxygen Saturation is 100% on RA, normal by my interpretation.    COORDINATION OF CARE: 3:03 AM PT left the room and tried to leave the ED. I will need to fill out IVC papers since he said he wanted a scalpel "to finish the job".    3:04 AM  I filled out IVC paperwork. Police have brought patient back to his room.   04:16 AM Psych holding orders written. TTS consult ordered.   5:45 AM TTS has evaluated patient and recommends inpatient admission.   Final Clinical Impressions(s) / ED Diagnoses   Final diagnoses:  Depression, unspecified depression type  Suicidal ideation  Self-inflicted injury    Disposition pending inpatient psychiatric admission  Devoria Albe, MD, FACEP   I personally performed the services described in this documentation, which was scribed in my presence. The recorded information has been reviewed and considered.  Devoria Albe, MD, Concha Pyo, MD 01/13/17 [phone]

## 2017-01-14 ENCOUNTER — Encounter (HOSPITAL_COMMUNITY): Payer: Self-pay | Admitting: Psychiatry

## 2017-01-14 DIAGNOSIS — R45851 Suicidal ideations: Secondary | ICD-10-CM

## 2017-01-14 DIAGNOSIS — I10 Essential (primary) hypertension: Secondary | ICD-10-CM | POA: Diagnosis present

## 2017-01-14 DIAGNOSIS — F845 Asperger's syndrome: Secondary | ICD-10-CM

## 2017-01-14 DIAGNOSIS — R4183 Borderline intellectual functioning: Secondary | ICD-10-CM

## 2017-01-14 DIAGNOSIS — F250 Schizoaffective disorder, bipolar type: Principal | ICD-10-CM

## 2017-01-14 DIAGNOSIS — G40909 Epilepsy, unspecified, not intractable, without status epilepticus: Secondary | ICD-10-CM

## 2017-01-14 DIAGNOSIS — F1290 Cannabis use, unspecified, uncomplicated: Secondary | ICD-10-CM

## 2017-01-14 DIAGNOSIS — F909 Attention-deficit hyperactivity disorder, unspecified type: Secondary | ICD-10-CM

## 2017-01-14 MED ORDER — PALIPERIDONE ER 3 MG PO TB24
3.0000 mg | ORAL_TABLET | Freq: Every day | ORAL | Status: DC
  Administered 2017-01-14 – 2017-01-15 (×2): 3 mg via ORAL
  Filled 2017-01-14 (×4): qty 1

## 2017-01-14 MED ORDER — BENZTROPINE MESYLATE 1 MG PO TABS
1.0000 mg | ORAL_TABLET | Freq: Every day | ORAL | Status: DC
  Administered 2017-01-15 – 2017-01-19 (×5): 1 mg via ORAL
  Filled 2017-01-14 (×6): qty 1

## 2017-01-14 MED ORDER — LEVETIRACETAM ER 500 MG PO TB24
500.0000 mg | ORAL_TABLET | Freq: Every day | ORAL | Status: DC
  Administered 2017-01-14 – 2017-01-20 (×7): 500 mg via ORAL
  Filled 2017-01-14 (×9): qty 1

## 2017-01-14 MED ORDER — LORAZEPAM 2 MG/ML IJ SOLN: 1.0000 mg | Freq: Four times a day (QID) | INTRAMUSCULAR | Status: DC | PRN

## 2017-01-14 MED ORDER — LORAZEPAM 1 MG PO TABS
1.0000 mg | ORAL_TABLET | Freq: Four times a day (QID) | ORAL | Status: DC | PRN
Start: 2017-01-14 — End: 2017-01-20
  Administered 2017-01-15 – 2017-01-19 (×7): 1 mg via ORAL
  Filled 2017-01-14 (×7): qty 1

## 2017-01-14 NOTE — [facility] Group Notes (Signed)
The focus of this group is to educate the patient on the purpose and policies of crisis stabilization and provide a format to answer questions about their admission.  The group details unit policies and expectations of patients while admitted.  Patient did not attend 0900 nurse education orientation group this morning.  Patient stayed in room.  

## 2017-01-14 NOTE — Progress Notes (Signed)
Patient has laid in bed all day.  Patient did go to dining room for dinner tonight.  Staff has encouraged him to participate in groups and get out of bed today. Safety maintained with 15 minute checks.

## 2017-01-14 NOTE — Progress Notes (Signed)
Recreation Therapy Notes  Date: 01/14/17 Time: 1000 Location: 300 Hall Dayroom  Group Topic: Self-Esteem  Goal Area(s) Addresses:  Patient will identify positive ways to increase self-esteem. Patient will verbalize benefit of increased self-esteem.  Intervention: Construction paper, markers  Activity: Personalized Plates.  Patients were to design personalized license plates that highlights their uniqueness.  Patients could show important dates, important events, things that are special to them or hidden talents they may have.  Education: Self-Esteem, Discharge Planning.   Education Outcome: Acknowledges education/In group clarification offered/Needs additional education  Clinical Observations/Feedback: Pt did not attend group.   Shatara Stanek, LRT/CTRS         Martese Vanatta A 01/14/2017 12:04 PM 

## 2017-01-14 NOTE — [facility] Group Notes (Signed)
[facility] LCSW Group Therapy  01/14/2017 , 12:42 PM   Type of Therapy:  Group Therapy  Participation Level:  Active  Participation Quality:  Attentive  Affect:  Appropriate  Cognitive:  Alert  Insight:  Improving  Engagement in Therapy:  Engaged  Modes of Intervention:  Discussion, Exploration and Socialization  Summary of Progress/Problems: Today's group focused on the term Diagnosis.  Participants were asked to define the term, and then pronounce whether it is a negative, positive or neutral term. Invited.  Chose to not attend.  Daryel Geraldorth, Esdras Delair B 01/14/2017 , 12:42 PM

## 2017-01-14 NOTE — Plan of Care (Signed)
Problem: Education: Goal: Ability to state activities that reduce stress will improve Outcome: Progressing Nurse discussed anxiety/depression/coping skills with patient.    
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
## 2017-01-14 NOTE — Progress Notes (Signed)
Patient has refused pneumonia vaccine today.
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
## 2017-01-14 NOTE — Progress Notes (Signed)
D:  Patient's self inventory sheet, patient sleeps good, sleep medication helpful.  Good appetite, low energy level, poor concentration.  Rated depression and anxiety #10, hopeless #5.  Denied withdrawals.  SI off/on, contracts for safety.  Denied HI.  Denied A/V hallucinations.  Denied pain.  Goal is feel better, feels very tired today.  No discharge plans. A:  Medications administered per MD orders.  Emotional support and encouragement given patient. R:  Denied HI.  Denied A/V hallucinations.  SI, off/on, no plans, contracts for safety.  Safety maintained with 15 minute checks.

## 2017-01-15 MED ORDER — DULOXETINE HCL 20 MG PO CPEP
40.0000 mg | ORAL_CAPSULE | Freq: Every day | ORAL | Status: DC
  Administered 2017-01-16: 40 mg via ORAL
  Filled 2017-01-15 (×3): qty 2
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
## 2017-01-15 NOTE — Progress Notes (Signed)
Recreation Therapy Notes  Date: 01/15/17 Time: 1000 Location: 300 Hall Dayroom  Group Topic: Leisure Education, Goal Setting  Goal Area(s) Addresses:  Patient will be able to identify at least 3 life goals.  Patient will be able to identify benefit of investing in life goals.  Patient will be able to identify benefit of setting life goals.   Intervention: Goals sheet, pencils  Activity: Life Goals.  Patients were to determine what they do well, where they need to improve and set a goal to make that improvement.  Patients had to set goals in the areas of family, friends, work/school, spirituality, body and mental health.  Education:  Discharge Planning, PharmacologistCoping Skills, Leisure Education   Education Outcome: Acknowledges Education/In Group Clarification Provided/Needs Additional Education  Clinical Observations:  Pt did not attend group.   Caroll RancherMarjette Khaalid Lefkowitz, LRT/CTRS         Caroll RancherLindsay, Lynsi Dooner A 01/15/2017 12:49 PM

## 2017-01-15 NOTE — Tx Team (Signed)
Interdisciplinary Treatment and Diagnostic Plan Update  01/15/2017 Time of Session: 8:23 AM  John Gibson MRN: [id number]  Principal Diagnosis: Schizoaffective disorder, bipolar type (HCC)  Secondary Diagnoses: Principal Problem:   Schizoaffective disorder, bipolar type (HCC) Active Problems:   Attention deficit hyperactivity disorder (ADHD)   Seizure disorder (HCC)   Essential hypertension   Borderline intellectual disability   Asperger syndrome   Current Medications:  Current Facility-Administered Medications  Medication Dose Route Frequency Provider Last Rate Last Dose  . acetaminophen (TYLENOL) tablet 650 mg  650 mg Oral Q4H PRN Charm Rings, NP      . alum & mag hydroxide-simeth (MAALOX/MYLANTA) 200-200-20 MG/5ML suspension 30 mL  30 mL Oral PRN Charm Rings, NP      . benztropine (COGENTIN) tablet 1 mg  1 mg Oral QHS Eappen, Saramma, MD      . DULoxetine (CYMBALTA) DR capsule 30 mg  30 mg Oral Daily Charm Rings, NP   30 mg at 01/15/17 0813  . hydrOXYzine (ATARAX/VISTARIL) tablet 50 mg  50 mg Oral Q6H PRN Charm Rings, NP      . ibuprofen (ADVIL,MOTRIN) tablet 600 mg  600 mg Oral Q8H PRN Charm Rings, NP      . levETIRAcetam (KEPPRA XR) 24 hr tablet 500 mg  500 mg Oral Daily Armandina Stammer I, NP   500 mg at 01/15/17 0813  . LORazepam (ATIVAN) tablet 1 mg  1 mg Oral Q6H PRN Jomarie Longs, MD   1 mg at 01/15/17 0530   Or  . LORazepam (ATIVAN) injection 1 mg  1 mg Intramuscular Q6H PRN Eappen, Saramma, MD      . magnesium hydroxide (MILK OF MAGNESIA) suspension 30 mL  30 mL Oral Daily PRN Charm Rings, NP      . nicotine polacrilex (NICORETTE) gum 2 mg  2 mg Oral PRN Jomarie Longs, MD      . ondansetron (ZOFRAN) tablet 4 mg  4 mg Oral Q8H PRN Charm Rings, NP      . paliperidone (INVEGA) 24 hr tablet 3 mg  3 mg Oral QHS Armandina Stammer I, NP   3 mg at 01/14/17 2151  . pneumococcal 23 valent vaccine (PNU-IMMUNE) injection 0.5 mL  0.5 mL Intramuscular Tomorrow-1000  Eappen, Saramma, MD      . traZODone (DESYREL) tablet 50 mg  50 mg Oral QHS PRN Charm Rings, NP        PTA Medications: Prescriptions Prior to Admission  Medication Sig Dispense Refill Last Dose  . benztropine (COGENTIN) 1 MG tablet Take 1 tablet (1 mg total) by mouth daily. (Patient not taking: Reported on 01/13/2017) 30 tablet 0 Not Taking at Unknown time  . DULoxetine (CYMBALTA) 30 MG capsule Take 1 capsule (30 mg total) by mouth daily. (Patient not taking: Reported on 01/13/2017) 30 capsule 0 Not Taking at Unknown time  . gabapentin (NEURONTIN) 300 MG capsule Take 1 capsule (300 mg total) by mouth 2 (two) times daily. (Patient not taking: Reported on 01/13/2017) 60 capsule 0 Not Taking at Unknown time  . OLANZapine (ZYPREXA) 15 MG tablet Take 1 tablet (15 mg total) by mouth at bedtime. (Patient not taking: Reported on 01/13/2017) 30 tablet 0 Not Taking at Unknown time  . traZODone (DESYREL) 50 MG tablet Take 1 tablet (50 mg total) by mouth at bedtime as needed for sleep. (Patient not taking: Reported on 01/13/2017) 30 tablet 0 Not Taking at Unknown time    Treatment Modalities:  Medication Management, Group therapy, Case management,  1 to 1 session with clinician, Psychoeducation, Recreational therapy.   Physician Treatment Plan for Primary Diagnosis: Schizoaffective disorder, bipolar type (HCC) Long Term Goal(s): Improvement in symptoms so as ready for discharge  Short Term Goals: Ability to identify changes in lifestyle to reduce recurrence of condition will improve Ability to verbalize feelings will improve Ability to disclose and discuss suicidal ideas Ability to demonstrate self-control will improve Ability to identify and develop effective coping behaviors will improve Compliance with prescribed medications will improve Ability to identify triggers associated with substance abuse/mental health issues will improve  Medication Management: Evaluate patient's response, side effects, and  tolerance of medication regimen.  Therapeutic Interventions: 1 to 1 sessions, Unit Group sessions and Medication administration.  Evaluation of Outcomes: Progressing  Physician Treatment Plan for Secondary Diagnosis: Principal Problem:   Schizoaffective disorder, bipolar type (HCC) Active Problems:   Attention deficit hyperactivity disorder (ADHD)   Seizure disorder (HCC)   Essential hypertension   Borderline intellectual disability   Asperger syndrome   Long Term Goal(s): Improvement in symptoms so as ready for discharge  Short Term Goals: Ability to identify changes in lifestyle to reduce recurrence of condition will improve Ability to verbalize feelings will improve Ability to disclose and discuss suicidal ideas Ability to demonstrate self-control will improve Ability to identify and develop effective coping behaviors will improve Compliance with prescribed medications will improve Ability to identify triggers associated with substance abuse/mental health issues will improve  Medication Management: Evaluate patient's response, side effects, and tolerance of medication regimen.  Therapeutic Interventions: 1 to 1 sessions, Unit Group sessions and Medication administration.  Evaluation of Outcomes: Progressing   RN Treatment Plan for Primary Diagnosis: Schizoaffective disorder, bipolar type (HCC) Long Term Goal(s): Knowledge of disease and therapeutic regimen to maintain health will improve  Short Term Goals: Ability to identify and develop effective coping behaviors will improve and Compliance with prescribed medications will improve  Medication Management: RN will administer medications as ordered by provider, will assess and evaluate patient's response and provide education to patient for prescribed medication. RN will report any adverse and/or side effects to prescribing provider.  Therapeutic Interventions: 1 on 1 counseling sessions, Psychoeducation, Medication  administration, Evaluate responses to treatment, Monitor vital signs and CBGs as ordered, Perform/monitor CIWA, COWS, AIMS and Fall Risk screenings as ordered, Perform wound care treatments as ordered.  Evaluation of Outcomes: Progressing   LCSW Treatment Plan for Primary Diagnosis: Schizoaffective disorder, bipolar type (HCC) Long Term Goal(s): Safe transition to appropriate next level of care at discharge, Engage patient in therapeutic group addressing interpersonal concerns.  Short Term Goals: Engage patient in aftercare planning with referrals and resources and Increase skills for wellness and recovery  Therapeutic Interventions: Assess for all discharge needs, 1 to 1 time with Social worker, Explore available resources and support systems, Assess for adequacy in community support network, Educate family and significant other(s) on suicide prevention, Complete Psychosocial Assessment, Interpersonal group therapy.  Evaluation of Outcomes: Not Progressing  Pt has not gotten out of bed today.  Will not engage in planning.   Progress in Treatment: Attending groups: No Participating in groups: No Taking medication as prescribed: Yes Toleration medication: Yes, no side effects reported at this time Family/Significant other contact made: No Patient understands diagnosis: Yes AEB asking for help with SI Discussing patient identified problems/goals with staff: Yes Medical problems stabilized or resolved: Yes Denies suicidal/homicidal ideation: Yes Issues/concerns per patient self-inventory: None Other: N/A  New  problem(s) identified: None identified at this time.   New Short Term/Long Term Goal(s): None identified at this time.   Discharge Plan or Barriers:   Reason for Continuation of Hospitalization:  Depression Hallucinations  Medication stabilization Suicidal ideation   Estimated Length of Stay: 3-5 days  Attendees: Patient: 01/15/2017  8:23 AM  Physician: Jomarie Longs,  MD 01/15/2017  8:23 AM  Nursing: Midge Aver, RN 01/15/2017  8:23 AM  RN Care Manager: Onnie Boer, RN 01/15/2017  8:23 AM  Social Worker: Richelle Ito 01/15/2017  8:23 AM  Recreational Therapist: Royston Cowper  01/15/2017  8:23 AM  Other: Tomasita Morrow 01/15/2017  8:23 AM  Other:  01/15/2017  8:23 AM    Scribe for Treatment Team:  Daryel Gerald LCSW 01/15/2017 8:23 AM
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
## 2017-01-15 NOTE — Progress Notes (Signed)
DAR NOTE: Pt present with flat affect and depressed mood in the unit. Pt has been isolating himself and has been bed most of the time. Pt denies physical pain, took all his meds as scheduled. As per self inventory, pt had a good night sleep, good appetite, normal energy, and good concentration. Pt rate depression at 0, hopeless ness at 0. Pt's safety ensured with 15 minute and environmental checks. Pt currently denies SI/HI and A/V hallucinations. Pt verbally agrees to seek staff if SI/HI or A/VH occurs and to consult with staff before acting on these thoughts. Will continue POC. 

## 2017-01-15 NOTE — [facility] Group Notes (Signed)
[facility] Group Notes:  (Counselor/Nursing/MHT/Case Management/Adjunct)  01/15/2017 1:15PM  Type of Therapy:  Group Therapy  Participation Level:  Active  Participation Quality:  Appropriate  Affect:  Flat  Cognitive:  Oriented  Insight:  Improving  Engagement in Group:  Limited  Engagement in Therapy:  Limited  Modes of Intervention:  Discussion, Exploration and Socialization  Summary of Progress/Problems: The topic for group was balance in life.  Pt participated in the discussion about when their life was in balance and out of balance and how this feels.  Pt discussed ways to get back in balance and short term goals they can work on to get where they want to be. Invited.  Chose to not attend.   Ida Rogueorth, Berlie Hatchel B 01/15/2017 3:34 PM

## 2017-01-15 NOTE — Progress Notes (Signed)
Patient ID: John Gibson, male   DOB: 02-21-1991, 26 y.o.   MRN: [id number] D: Client sleeps most of this shift, up with much persuasion for medications administration. Client also gets a snack. Client reports "tired" when ask about goals for treatment "I don't remember" A:Write provided emotional support encouraged interaction with little response. Medications reviewed, administered as ordered. Staff will monitor q5115min for safety. R: client is safe on the unit, did not attend group.

## 2017-01-15 NOTE — Plan of Care (Signed)
Problem: Medication: Goal: Compliance with prescribed medication regimen will improve Outcome: Progressing Pt compliant with medication regimen.   

## 2017-01-15 NOTE — [facility] Counselor (Signed)
Pt referred to [facility] program-[facility] to see tomorrow.

## 2017-01-16 DIAGNOSIS — F17210 Nicotine dependence, cigarettes, uncomplicated: Secondary | ICD-10-CM

## 2017-01-16 LAB — TSH: TSH: 2.264 u[IU]/mL (ref 0.350–4.500)

## 2017-01-16 LAB — LIPID PANEL
Cholesterol: 210 mg/dL — ABNORMAL HIGH (ref 0–200)
HDL: 32 mg/dL — ABNORMAL LOW (ref >40)
LDL CALC: 139 mg/dL — AB (ref 0–99)
TRIGLYCERIDES: 196 mg/dL — AB (ref <150)
Total CHOL/HDL Ratio: 6.6 RATIO
VLDL: 39 mg/dL (ref 0–40)

## 2017-01-16 MED ORDER — DIPHENHYDRAMINE HCL 50 MG/ML IJ SOLN
50.0000 mg | Freq: Four times a day (QID) | INTRAMUSCULAR | Status: DC | PRN
  Administered 2017-01-16: 50 mg via INTRAMUSCULAR
  Filled 2017-01-16: qty 1

## 2017-01-16 MED ORDER — ATORVASTATIN CALCIUM 40 MG PO TABS
40.0000 mg | ORAL_TABLET | Freq: Every day | ORAL | Status: DC
  Administered 2017-01-16 – 2017-01-19 (×4): 40 mg via ORAL
  Filled 2017-01-16 (×6): qty 1

## 2017-01-16 MED ORDER — HALOPERIDOL LACTATE 5 MG/ML IJ SOLN
5.0000 mg | Freq: Four times a day (QID) | INTRAMUSCULAR | Status: DC | PRN
  Administered 2017-01-16: 5 mg via INTRAMUSCULAR
  Filled 2017-01-16: qty 1

## 2017-01-16 MED ORDER — PALIPERIDONE ER 6 MG PO TB24
6.0000 mg | ORAL_TABLET | Freq: Every day | ORAL | Status: DC
  Administered 2017-01-16 – 2017-01-19 (×4): 6 mg via ORAL
  Filled 2017-01-16: qty 2
  Filled 2017-01-16 (×5): qty 1

## 2017-01-16 MED ORDER — DULOXETINE HCL 20 MG PO CPEP
20.0000 mg | ORAL_CAPSULE | Freq: Every day | ORAL | Status: AC
  Administered 2017-01-17: 20 mg via ORAL
  Filled 2017-01-16: qty 1
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
## 2017-01-16 NOTE — [facility] Suicide Risk Assessment (Signed)
[facility] INPATIENT:  Family/Significant Other Suicide Prevention Education  Suicide Prevention Education:  Patient Refusal for Family/Significant Other Suicide Prevention Education: The patient John Gibson has refused to provide written consent for family/significant other to be provided Family/Significant Other Suicide Prevention Education during admission and/or prior to discharge.  Physician notified.  Ida RogueRodney B Wahid Holley 01/16/2017, 3:19 PM

## 2017-01-16 NOTE — [facility] Group Notes (Signed)
[facility]  01/16/2017 , 12:57 PM    Type of Therapy:  [facility] Presentation  Participation Level:  Active  Participation Quality:  Attentive  Affect:  Blunted  Cognitive:  Oriented  Insight:  Limited  Engagement in Therapy:  Engaged  Modes of Intervention:  Discussion, Education and Socialization  Summary of Progress/Problems:  Onalee HuaDavid from [employer] came to present his recovery story and play the guitar.  Invited.  Chose to not attend.  Daryel Geraldorth, Sharlette Jansma B 01/16/2017 , 12:57 PM
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
## 2017-01-16 NOTE — Progress Notes (Signed)
Recreation Therapy Notes  Date: 01/16/17 Time: 1000 Location: 300 Hall Dayroom  Group Topic: Coping Skills  Goal Area(s) Addresses:  Patient will be able to positive coping skills. Patient will be able to identify benefits of using positive coping skills. Patient will be able to benefits of using coping skills post d/c.  Intervention: Mindmap, pencils  Activity: Mindmap.  Patients were given a blank mindmap.  Patients and LRT filled in the first eight boxes together.  Patients were to then identify three coping skills for each of the stressors identified by the patients and LRT.  Once patients identified their coping skills, LRT would fill in the coping skills on the board so patients could fill in any blank spaces they may have had on their sheets.  Education: PharmacologistCoping Skills, Building control surveyorDischarge Planning.   Education Outcome: Acknowledges understanding/In group clarification offered/Needs additional education.   Clinical Observations/Feedback: Pt did not attend group.   Caroll RancherMarjette Seana Underwood, LRT/CTRS         Caroll RancherLindsay, Winfred Redel A 01/16/2017 12:03 PM

## 2017-01-16 NOTE — Plan of Care (Signed)
Problem: Safety: Goal: Periods of time without injury will increase Outcome: Progressing Pt has not harmed self or others tonight.  He denies SI/HI and verbally contracts for safety.   

## 2017-01-16 NOTE — Plan of Care (Signed)
Problem: Coping: Goal: Ability to verbalize frustrations and anger appropriately will improve Outcome: Not Progressing Pt has been loud and threatening with anger outbursts on the unit today.

## 2017-01-16 NOTE — Progress Notes (Signed)
D:Pt talked with MD and became agitated when he was told that he was not leaving the hospital today. Writer, AC, MHT and SW attempted to talk and de escalate the pt. Pt continued to be fixated and threatening to leave. He became loud and intrusive on the unit.  A:Gave IM medications as ordered. Offered support, redirection and encouragement. R:Pt denies si and hi. Safety maintained on the unit.

## 2017-01-16 NOTE — Progress Notes (Signed)
D: Pt was in his room upon initial approach.  Pt presents with depressed, anxious affect and mood.  He reports he is "just tired."  Pt denies having a goal today.  Pt denies SI/HI, denies hallucinations, denies pain.  Pt paced the hallway at times tonight.  He did not attend evening group.   A: Introduced self to pt.  Actively listened to pt and offered support and encouragement. Medications administered per order.  Pt requested and received PRN medication for anxiety, sleep, and indigestion.  Q15 minute safety checks maintained.  R: Pt is safe on the unit.  Pt is compliant with medications.  He reports he will inform staff of needs and concerns.  Pt verbally contracts for safety.  Will continue to monitor and assess.

## 2017-01-16 NOTE — Progress Notes (Signed)
Recreation Therapy Notes  INPATIENT RECREATION THERAPY ASSESSMENT  Patient Details Name: John Gibson MRN: [id number] DOB: 1990/10/02 Today's Date: 01/16/2017  Patient Stressors: Other (Comment) (Getting medications filled)  Pt stated he was here for being suicidal. Pt stated he was physically and mentally exhausted.  Coping Skills:   Isolate, Substance Abuse, Avoidance, Exercise, Art/Dance, Talking, Music  Personal Challenges: Anger, Communication, Concentration, Decision-Making, Expressing Yourself, Problem-Solving, Relationships, Self-Esteem/Confidence, Social Interaction, Stress Management, Time Management, Trusting Others, Work Nutritional therapisterformance  Leisure Interests (2+):  Games - Clinical cytogeneticistVideo games, Music - Listen, Garment/textile technologistCommunity - OrthoptistTheater/Cinema, Individual - Other (Comment) ([employer])  Awareness of Community Resources:  Yes  Community Resources:  Library, Other (Comment) (IRC)  Current Use: Yes  Patient Strengths:  Calm, kind, loving, understanding, good listener  Patient Identified Areas of Improvement:  Coping skills  Current Recreation Participation:  Everyday  Patient Goal for Hospitalization:  "Get the hell out of here and get outpatient care"  [place]  [place]  Current [place] (including self-harm):  No  Current HI:  No  Consent to Intern Participation: N/A   Caroll RancherMarjette Aviv Lengacher, LRT/CTRS  Caroll RancherLindsay, Lazara Grieser A 01/16/2017, 12:27 PM

## 2017-01-17 LAB — HEMOGLOBIN A1C
Hgb A1c MFr Bld: 4.9 % (ref 4.8–5.6)
Mean Plasma Glucose: 94 mg/dL

## 2017-01-17 LAB — PROLACTIN: PROLACTIN: 47.6 ng/mL — AB (ref 4.0–15.2)

## 2017-01-17 MED ORDER — PALIPERIDONE ER 3 MG PO TB24
3.0000 mg | ORAL_TABLET | Freq: Once | ORAL | Status: AC
  Administered 2017-01-17: 3 mg via ORAL
  Filled 2017-01-17 (×2): qty 1

## 2017-01-17 MED ORDER — PALIPERIDONE ER 3 MG PO TB24
3.0000 mg | ORAL_TABLET | Freq: Every day | ORAL | Status: DC
  Administered 2017-01-18 – 2017-01-20 (×3): 3 mg via ORAL
  Filled 2017-01-17 (×4): qty 1

## 2017-01-17 NOTE — Progress Notes (Signed)
Recreation Therapy Notes  Date: 01/17/17 Time: 1000 Location: 300 Hall Dayroom  Group Topic: Stress Management  Goal Area(s) Addresses:  Patient will verbalize importance of using healthy stress management.  Patient will identify positive emotions associated with healthy stress management.   Intervention: Stress Management  Activity :  Peaceful Waves, Music.  LRT introduced the stress management techniques of guided imagery and music.  Patients were to listen and follow along as LRT read script to participate in guided imagery.  Patients also listened to music, socialized and moved along with the music.  Education:  Stress Management, Discharge Planning.   Education Outcome: Acknowledges edcuation/In group clarification offered/Needs additional education  Clinical Observations/Feedback: Pt did not attend group.   Reise Gladney, LRT/CTRS         Zaida Reiland A 01/17/2017 11:34 AM 

## 2017-01-17 NOTE — [facility] Group Notes (Signed)
[facility] LCSW Group Therapy  01/17/2017  1:05 PM  Type of Therapy:  Group therapy  Participation Level:  Active  Participation Quality:  Attentive  Affect:  Flat  Cognitive:  Oriented  Insight:  Limited  Engagement in Therapy:  Limited  Modes of Intervention:  Discussion, Socialization  Summary of Progress/Problems:  Chaplain was here to lead a group on themes of hope and courage. Invited.  Chose to not attend.  John Gibson, John Gibson B 01/17/2017 11:10 AM

## 2017-01-17 NOTE — Progress Notes (Signed)
On initial assessment, pt was in his room in bed.  He easily awoke when Clinical research associatewriter called his name.  He stated that he had been given some medicine earlier that made him sleep.  He then stated that he wants to discharge soon because [place].  Writer told pt that she was familiar with that area and talked with the patient about different things in [place], and was able to establish a rapport with the patient.  He still is anxious about when he can be discharged, but he has remained calm and appropriate.  Pt denies SI/HI/AVH.  He voices no needs or concerns.  He took his HS meds along with a Trazodone for sleep without incident.  Support and encouragement offered.  Discharge plans are in process.  Pt hopes to discharge tomorrow.  Safety maintained with q15 minute checks.
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
## 2017-01-17 NOTE — Progress Notes (Signed)
Adult Psychoeducational Group Note  Date:  01/17/2017 Time:  3:57 AM  Group Topic/Focus:  Wrap-Up Group:   The focus of this group is to help patients review their daily goal of treatment and discuss progress on daily workbooks.  Participation Level:  Did Not Attend  Additional Comments:  Pt did not attend, pt remained in bed and encouraged to come to group.  Karleen HampshireFox, Trung Wenzl Brittini 01/17/2017, 3:57 AM

## 2017-01-17 NOTE — Progress Notes (Signed)
Nursing Note 01/17/2017 [id number]  Reports sleeping well.  Did not complete self-inventory sheet.  Mood labile throughout day but stabilized by evening time.  Patient was seen pacing in hallway clenching fists this AM.  Patient had difficulty accepting that he would not discharge today, was yelling and swearing, calling the psychiatrist names.  RN spoke with patient at length about reasons for not discharging- mainly due to him needing to have injections yesterday for punching wall.  RN allowed patient to vent.  RN spoke with patient and doctor at same time about the plan to keep patient over the weekend, patient got very frustrated.  Wished to leave today.  RN verbally de-escalated patient, gave patient one time dose of invega per MD, PRN ativan.  Patients behavior improved as day went on- less irritable and agitated in the afternoon.  Denies SI HI and AVH.  Pleasant by evening time.  Smiling in evening.  Isolates in room most of day, sometimes comes to desk to talk to nurse.  Not attending groups.  Remains safe on unit today.

## 2017-01-18 NOTE — Progress Notes (Signed)
Adult Psychoeducational Group Note  Date:  01/18/2017 Time:  4:44 AM  Group Topic/Focus:  Wrap-Up Group:   The focus of this group is to help patients review their daily goal of treatment and discuss progress on daily workbooks.  Participation Level:  Did Not Attend  Participation Quality:  Patient did not attend  Affect:  Patient did not attend  Cognitive:  Patient did not attend  Insight: None  Engagement in Group:  Patient did not attend  Modes of Intervention:  Patient did not attend  Additional Comments:  Patient did not attend evening wrap up group because he attended his AA meeting group and Patient was appropriate.  Felipa FurnaceChristopher  Gitty Osterlund 01/18/2017, 4:44 AM

## 2017-01-18 NOTE — [facility] Group Notes (Signed)
[facility] LCSW Group Therapy Note  01/18/2017  and  11:00 AM  Type of Therapy and Topic:  Group Therapy: Avoiding Self-Sabotaging and Enabling Behaviors  Participation Level:  Did Not Attend despite overhead announcement and face to face invitation    Carney Bernatherine C Tremain Rucinski, LCSW
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
## 2017-01-18 NOTE — Progress Notes (Signed)
DAR NOTE: Patient presents with anxious affect and mood.  Denies pain, auditory and visual hallucinations.  Described energy level as normal and concentration as good.  Rates depression at 0, hopelessness at 0, and anxiety at 2.  Maintained on routine safety checks.  Medications given as prescribed.  Support and encouragement offered as needed.  Attended group and participated.  States goal for today is "working on discharge."  Patient on the phone most of this shift calling different facilities in an attempt to set up services post discharge.  Offered no complaint.

## 2017-01-18 NOTE — Progress Notes (Signed)
Pt is still frustrated that he did not discharge today.  He is now talking about wanting to go to a group home.  He also says he is still open to [place] maybe [place] to a shelter.  He says that he was told today that he would probably be here until Monday, but he really wants to be discharged before then.  He really isn't sure where he wants to go as long as he can be discharged out of the hospital.  Pt spent a lot of time talking about his frustrations and that [facility].  He denies SI/HI/AVH.  He has been cooperative with staff this evening and has been appropriate in his behaviors.  He has been up the nurse's station a lot this evening.  Support and encouragement offered.  Discharge plans are in process.  Pt was encouraged to speak to the CSW tomorrow about his concerns for discharge.  Safety maintained with q15 minute checks.

## 2017-01-19 NOTE — [facility] Group Notes (Signed)
[facility] LCSW Group Therapy  01/19/2017  11 AM  Type of Therapy:  Group Therapy  Participation Level:  Did Not Attend; invited to participate yet did not despite overhead announcement and encouragement by staff   Summary of Progress/Problems: Topic for today was thoughts and feelings regarding discharge. We discussed fears of upcoming changes including judgements, expectations and stigma of mental health issues. We then discussed supports: what constitutes a supportive framework, identification of supports and what to do when others are not supportive. Patients processed their greatest challenges  Catherine C Harrill, LCSW    

## 2017-01-19 NOTE — Progress Notes (Signed)
Psychoeducational Group Note  Date:  01/19/2017 Time: 2030 Group Topic/Focus:  wrap up group  Participation Level: Did Not Attend  Participation Quality:  Not Applicable  Affect:  Not Applicable  Cognitive:  Not Applicable  Insight:  Not Applicable  Engagement in Group: Not Applicable  Additional Comments:  Pt was sleeping during group time.   John Gibson, John Gibson 01/19/2017, 8:42 PM

## 2017-01-19 NOTE — Progress Notes (Signed)
D:  Patient refused to fill out self inventory form today.  After vistaril and ativan given this morning, and staff talking to patient, this patient has been calm.  Patient has been resting in bed this afternoon.  Respirations even and unlabored.  No signs/symptoms of pain/distress noted on patient's face/body movements. A:  Medications administered per MD orders.  Emotional support and encouragement given patient. R:  Patient denied SI and HI, contracts for safety.  Denied A/V hallucinations.  Safety maintained with 15 minute checks.

## 2017-01-19 NOTE — Plan of Care (Signed)
Problem: Education: Goal: Utilization of techniques to improve thought processes will improve Outcome: Progressing Nurse discussed depression/anxiety/coping skills with patient.    

## 2017-01-19 NOTE — Progress Notes (Signed)
Patient has been upset talking on phone in dayroom to his family.  Talking about his gay rights.  Cursing loudly on phone to other patients in dayroom and to MHT.  AC, charge nurse, and staff have been talking to patient.  Patient has become calm, door to dayroom closed for patient's privacy.  Respirations even and unlabored.  No signs/symptoms of pain/distress noted on patient's face/body movements.  MD informed.

## 2017-01-19 NOTE — Progress Notes (Signed)
Nursing Progress Note: 7p-7a D: Pt currently presents with a childlike/invasive/assertive affect and behavior. Pt states "I really like getting good help here. I just need to stay away from negative influences." Interacting appropriately with milieu. Pt reports fair sleep with current medication regimen.   A: Pt provided with medications per providers orders. Pt's labs and vitals were monitored throughout the night. Pt supported emotionally and encouraged to express concerns and questions. Pt educated on medications.  R: Pt's safety ensured with 15 minute and environmental checks. Pt currently denies SI/HI/Self Harm and AVH. Pt verbally contracts to seek staff if SI/HI or A/VH occurs and to consult with staff before acting on any harmful thoughts. Will continue to monitor.
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
## 2017-01-20 MED ORDER — BENZTROPINE MESYLATE 1 MG PO TABS: 1.0000 mg | ORAL_TABLET | Freq: Every day | ORAL | 0 refills | Status: DC

## 2017-01-20 MED ORDER — ATORVASTATIN CALCIUM 40 MG PO TABS: 40.0000 mg | ORAL_TABLET | Freq: Every day | ORAL | 0 refills | Status: DC

## 2017-01-20 MED ORDER — PALIPERIDONE ER 6 MG PO TB24: 6.0000 mg | ORAL_TABLET | Freq: Every day | ORAL | 0 refills | Status: DC

## 2017-01-20 MED ORDER — PALIPERIDONE ER 3 MG PO TB24: 3.0000 mg | ORAL_TABLET | Freq: Every day | ORAL | 0 refills | Status: DC

## 2017-01-20 MED ORDER — TRAZODONE HCL 50 MG PO TABS: 50.0000 mg | ORAL_TABLET | Freq: Every evening | ORAL | 0 refills | Status: DC | PRN

## 2017-01-20 MED ORDER — LEVETIRACETAM ER 500 MG PO TB24: 500.0000 mg | ORAL_TABLET | Freq: Every day | ORAL | 0 refills | Status: DC

## 2017-01-20 MED ORDER — HYDROXYZINE HCL 50 MG PO TABS: 50.0000 mg | ORAL_TABLET | Freq: Four times a day (QID) | ORAL | 0 refills | Status: DC | PRN

## 2017-01-20 MED ORDER — NICOTINE POLACRILEX 2 MG MT GUM: 2.0000 mg | CHEWING_GUM | OROMUCOSAL | 0 refills | Status: DC | PRN

## 2017-01-20 NOTE — Plan of Care (Signed)
Problem: [facility] Participation in Recreation Therapeutic Interventions Goal: STG-Patient will identify at least five coping skills for ** STG: Coping Skills - Patient will be able to identify at least 5 coping skills for suicidal thoughts by conclusion of recreation therapy tx  Outcome: Not Met (add Reason) Pt did not attend groups.  Victorino Sparrow, LRT/CTRS
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
## 2017-01-20 NOTE — Progress Notes (Signed)
Nursing Progress Note: 7p-7a D: Pt currently presents with a childlike/pleasant/silly affect and behavior. Pt states "I am ready to go home. I want to leave by tomorrow." Interacting appropriately with milieu. Pt reports good sleep with current medication regimen.   A: Pt provided with medications per providers orders. Pt's labs and vitals were monitored throughout the night. Pt supported emotionally and encouraged to express concerns and questions. Pt educated on medications.  R: Pt's safety ensured with 15 minute and environmental checks. Pt currently denies SI/HI/Self Harm and AVH. Pt verbally contracts to seek staff if SI/HI or A/VH occurs and to consult with staff before acting on any harmful thoughts. Will continue to monitor.

## 2017-01-20 NOTE — Progress Notes (Signed)
Recreation Therapy Notes  Date: 01/20/17 Time: 1000 Location: 300 [address] Dayroom  Group Topic: Coping Skills  Goal Area(s) Addresses:  Patient will be able to identify positive coping skills. Patient will be able to identify benefits of coping skills. Patient will be able to identify benefits of using coping skills post d/c.  Intervention: Coping skills worksheet, magazines, scissors, glue sticks, construction paper  Activity: Coping skills collage.  Patients were to identify coping skills that can be used for diversions, cognitive, social, tension releasers and for physical.   Education: Coping Skills, Discharge Planning.   Education Outcome: Acknowledges understanding/In group clarification offered/Needs additional education.   Clinical Observations/Feedback: Pt did not attend group.   Caroll RancherMarjette Prakriti Carignan, LRT/CTRS         Caroll RancherLindsay, Cayleigh Paull A 01/20/2017 12:02 PM

## 2017-01-20 NOTE — Progress Notes (Addendum)
  [facility] Discharge Plan :  Will you be returning to the same living situation after discharge:  No. At discharge, do you have transportation home?: Yes,  [employer] ticket [place] Do you have the ability to pay for your medications: Yes,  MCD  Release of information consent forms completed and in the chart;  Patient's signature needed at discharge.  Patient to Follow up at: Follow-up Information    [facility] Follow up on 01/21/2017.   Why:  Tuesday at 9:00 to open your chart for medications management and therapy services.  Bring your ID and your MCD card Contact information: [address] , [place], [place] [address] [phone]  [phone]          Next level of care provider has access to [facility] Link:no  Safety Planning and Suicide Prevention discussed: Yes,  yes  Have you used any form of tobacco in the last 30 days? (Cigarettes, Smokeless Tobacco, Cigars, and/or Pipes): Yes  Has patient been referred to the Quitline?: Patient refused referral  Patient has been referred for addiction treatment: Pt. refused referral  John Gibson 01/20/2017, 12:17 PM

## 2017-01-20 NOTE — Progress Notes (Signed)
Pt d/c from the hospital. All items returned. D/C instructions, samples given given and prescriptions given. Pt denies si and hi.

## 2017-01-20 NOTE — Tx Team (Signed)
Interdisciplinary Treatment and Diagnostic Plan Update  01/20/2017 Time of Session: 8:20 AM  John Gibson MRN: [id number]  Principal Diagnosis: Schizoaffective disorder, bipolar type (HCC)  Secondary Diagnoses: Principal Problem:   Schizoaffective disorder, bipolar type (HCC) Active Problems:   Attention deficit hyperactivity disorder (ADHD)   Seizure disorder (HCC)   Essential hypertension   Borderline intellectual disability   Asperger syndrome   Current Medications:  Current Facility-Administered Medications  Medication Dose Route Frequency Provider Last Rate Last Dose  . acetaminophen (TYLENOL) tablet 650 mg  650 mg Oral Q4H PRN Charm Rings, NP      . alum & mag hydroxide-simeth (MAALOX/MYLANTA) 200-200-20 MG/5ML suspension 30 mL  30 mL Oral PRN Charm Rings, NP   30 mL at 01/19/17 1144  . atorvastatin (LIPITOR) tablet 40 mg  40 mg Oral q1800 Armandina Stammer I, NP   40 mg at 01/19/17 1700  . benztropine (COGENTIN) tablet 1 mg  1 mg Oral QHS Eappen, Levin Bacon, MD   1 mg at 01/19/17 2122  . diphenhydrAMINE (BENADRYL) injection 50 mg  50 mg Intramuscular Q6H PRN Izediuno, Vincent A, MD   50 mg at 01/16/17 1431  . haloperidol lactate (HALDOL) injection 5 mg  5 mg Intramuscular Q6H PRN Izediuno, Delight Ovens, MD   5 mg at 01/16/17 1431  . hydrOXYzine (ATARAX/VISTARIL) tablet 50 mg  50 mg Oral Q6H PRN Charm Rings, NP   50 mg at 01/19/17 2228  . ibuprofen (ADVIL,MOTRIN) tablet 600 mg  600 mg Oral Q8H PRN Charm Rings, NP   600 mg at 01/18/17 2247  . levETIRAcetam (KEPPRA XR) 24 hr tablet 500 mg  500 mg Oral Daily Armandina Stammer I, NP   500 mg at 01/19/17 0744  . LORazepam (ATIVAN) tablet 1 mg  1 mg Oral Q6H PRN Jomarie Longs, MD   1 mg at 01/19/17 2337   Or  . LORazepam (ATIVAN) injection 1 mg  1 mg Intramuscular Q6H PRN Eappen, Saramma, MD      . magnesium hydroxide (MILK OF MAGNESIA) suspension 30 mL  30 mL Oral Daily PRN Charm Rings, NP   30 mL at 01/18/17 1937  . nicotine  polacrilex (NICORETTE) gum 2 mg  2 mg Oral PRN Jomarie Longs, MD   2 mg at 01/19/17 2122  . ondansetron (ZOFRAN) tablet 4 mg  4 mg Oral Q8H PRN Charm Rings, NP      . paliperidone (INVEGA) 24 hr tablet 3 mg  3 mg Oral Daily Izediuno, Delight Ovens, MD   3 mg at 01/19/17 0744  . paliperidone (INVEGA) 24 hr tablet 6 mg  6 mg Oral QHS Izediuno, Delight Ovens, MD   6 mg at 01/19/17 2122  . pneumococcal 23 valent vaccine (PNU-IMMUNE) injection 0.5 mL  0.5 mL Intramuscular Tomorrow-1000 Eappen, Saramma, MD      . traZODone (DESYREL) tablet 50 mg  50 mg Oral QHS PRN Charm Rings, NP   50 mg at 01/19/17 2337    PTA Medications: Prescriptions Prior to Admission  Medication Sig Dispense Refill Last Dose  . benztropine (COGENTIN) 1 MG tablet Take 1 tablet (1 mg total) by mouth daily. (Patient not taking: Reported on 01/13/2017) 30 tablet 0 Not Taking at Unknown time  . DULoxetine (CYMBALTA) 30 MG capsule Take 1 capsule (30 mg total) by mouth daily. (Patient not taking: Reported on 01/13/2017) 30 capsule 0 Not Taking at Unknown time  . gabapentin (NEURONTIN) 300 MG capsule Take 1 capsule (  300 mg total) by mouth 2 (two) times daily. (Patient not taking: Reported on 01/13/2017) 60 capsule 0 Not Taking at Unknown time  . OLANZapine (ZYPREXA) 15 MG tablet Take 1 tablet (15 mg total) by mouth at bedtime. (Patient not taking: Reported on 01/13/2017) 30 tablet 0 Not Taking at Unknown time  . traZODone (DESYREL) 50 MG tablet Take 1 tablet (50 mg total) by mouth at bedtime as needed for sleep. (Patient not taking: Reported on 01/13/2017) 30 tablet 0 Not Taking at Unknown time    Treatment Modalities: Medication Management, Group therapy, Case management,  1 to 1 session with clinician, Psychoeducation, Recreational therapy.   Physician Treatment Plan for Primary Diagnosis: Schizoaffective disorder, bipolar type (HCC) Long Term Goal(s): Improvement in symptoms so as ready for discharge  Short Term Goals: Ability to  identify changes in lifestyle to reduce recurrence of condition will improve Ability to verbalize feelings will improve Ability to disclose and discuss suicidal ideas Ability to demonstrate self-control will improve Ability to identify and develop effective coping behaviors will improve Compliance with prescribed medications will improve Ability to identify triggers associated with substance abuse/mental health issues will improve  Medication Management: Evaluate patient's response, side effects, and tolerance of medication regimen.  Therapeutic Interventions: 1 to 1 sessions, Unit Group sessions and Medication administration.  Evaluation of Outcomes: Adequate for Discharge  Physician Treatment Plan for Secondary Diagnosis: Principal Problem:   Schizoaffective disorder, bipolar type (HCC) Active Problems:   Attention deficit hyperactivity disorder (ADHD)   Seizure disorder (HCC)   Essential hypertension   Borderline intellectual disability   Asperger syndrome   Long Term Goal(s): Improvement in symptoms so as ready for discharge  Short Term Goals: Ability to identify changes in lifestyle to reduce recurrence of condition will improve Ability to verbalize feelings will improve Ability to disclose and discuss suicidal ideas Ability to demonstrate self-control will improve Ability to identify and develop effective coping behaviors will improve Compliance with prescribed medications will improve Ability to identify triggers associated with substance abuse/mental health issues will improve  Medication Management: Evaluate patient's response, side effects, and tolerance of medication regimen.  Therapeutic Interventions: 1 to 1 sessions, Unit Group sessions and Medication administration.  Evaluation of Outcomes: Adequate for Discharge   RN Treatment Plan for Primary Diagnosis: Schizoaffective disorder, bipolar type (HCC) Long Term Goal(s): Knowledge of disease and therapeutic  regimen to maintain health will improve  Short Term Goals: Ability to identify and develop effective coping behaviors will improve and Compliance with prescribed medications will improve  Medication Management: RN will administer medications as ordered by provider, will assess and evaluate patient's response and provide education to patient for prescribed medication. RN will report any adverse and/or side effects to prescribing provider.  Therapeutic Interventions: 1 on 1 counseling sessions, Psychoeducation, Medication administration, Evaluate responses to treatment, Monitor vital signs and CBGs as ordered, Perform/monitor CIWA, COWS, AIMS and Fall Risk screenings as ordered, Perform wound care treatments as ordered.  Evaluation of Outcomes: Adequate for Discharge   LCSW Treatment Plan for Primary Diagnosis: Schizoaffective disorder, bipolar type (HCC) Long Term Goal(s): Safe transition to appropriate next level of care at discharge, Engage patient in therapeutic group addressing interpersonal concerns.  Short Term Goals: Engage patient in aftercare planning with referrals and resources and Increase skills for wellness and recovery  Therapeutic Interventions: Assess for all discharge needs, 1 to 1 time with Social worker, Explore available resources and support systems, Assess for adequacy in community support network, Educate family and  significant other(s) on suicide prevention, Complete Psychosocial Assessment, Interpersonal group therapy.  Evaluation of Outcomes: Adequate for Discharge  Pt has not gotten out of bed today.  Will not engage in planning.   Progress in Treatment: Attending groups: No Participating in groups: No Taking medication as prescribed: Yes Toleration medication: Yes, no side effects reported at this time Family/Significant other contact made: No Patient understands diagnosis: Yes AEB asking for help with SI Discussing patient identified problems/goals with staff:  Yes Medical problems stabilized or resolved: Yes Denies suicidal/homicidal ideation: Yes Issues/concerns per patient self-inventory: None Other: N/A  New problem(s) identified: None identified at this time.   New Short Term/Long Term Goal(s): None identified at this time.   Discharge Plan or Barriers: Bus ticket to [place], get into shelter there, Follow up [facility]  Reason for Continuation of Hospitalization:  Estimated Length of Stay: D/C today  Attendees: Patient: 01/20/2017  8:20 AM  Physician: Jomarie LongsSaramma Eappen, MD 01/20/2017  8:20 AM  Nursing: Midge AverJane Ochieng, RN 01/20/2017  8:20 AM  RN Care Manager: Onnie BoerJennifer Clark, RN 01/20/2017  8:20 AM  Social Worker: Richelle Itood Tonie Elsey 01/20/2017  8:20 AM  Recreational Therapist: Royston CowperMarjette  01/20/2017  8:20 AM  Other: Tomasita Morrowelora Sutton 01/20/2017  8:20 AM  Other:  01/20/2017  8:20 AM    Scribe for Treatment Team:  Daryel Geraldodney Aamari West LCSW 01/20/2017 8:20 AM

## 2017-01-20 NOTE — [facility] Suicide Risk Assessment (Signed)
[facility]   Principal Problem: Schizoaffective disorder, bipolar type [facility](HCC) Discharge Diagnoses:  Patient Active Problem List   Diagnosis Date Noted  . Attention deficit hyperactivity disorder (ADHD) [F90.9] 01/14/2017  . Seizure disorder (HCC) [G40.909] 01/14/2017  . Essential hypertension [I10] 01/14/2017  . 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 Asperger syndrome [F84.5] 01/14/2017  . Suicidal thoughts [R45.851]   . Asthma exacerbation [J45.901] 12/28/2012  . Hypokalemia [E87.6] 12/28/2012  . Schizoaffective disorder, bipolar type (HCC) [F25.0] 12/28/2012  . Tobacco abuse [Z72.0] 12/28/2012    Total Time spent with patient: 30 minutes  Musculoskeletal: Strength & Muscle Tone: within normal limits Gait & Station: normal Patient leans: N/A  Psychiatric Specialty Exam: Review of Systems  Psychiatric/Behavioral: Negative for depression, hallucinations and suicidal ideas. The patient is not nervous/anxious.   All other systems reviewed and are negative.   Blood pressure (!) 128/59, pulse 75, temperature 98.2 F (36.8 C), resp. rate 16, height 5' 5.75" (1.67 m), weight 112.9 kg (249 lb), SpO2 99 %.Body mass index is 40.5 kg/m.  General Appearance: Casual  Eye Contact::  Fair  Speech:  Clear and Coherent409  Volume:  Normal  Mood:  Euthymic  Affect:  Appropriate  Thought Process:  Goal Directed and Descriptions of Associations: Intact  Orientation:  Full (Time, Place, and Person)  Thought Content:  Logical  Suicidal Thoughts:  No  Homicidal Thoughts:  No  Memory:  Immediate;   Fair Recent;   Fair Remote;   Fair  Judgement:  Fair  Insight:  Shallow  Psychomotor Activity:  Normal  Concentration:  Fair  Recall:  [employer]Fair  Fund of Knowledge:Fair  Language: Fair  Akathisia:  No  Handed:  Right  AIMS (if indicated):     Assets:  Communication Skills Desire for Improvement  Sleep:  Number of Hours: 4.75  Cognition: WNL   ADL's:  Intact   Mental Status Per Nursing Assessment::   On Admission:  Suicidal ideation indicated by others  Demographic Factors:  Male and Caucasian  Loss Factors: NA  Historical Factors: Impulsivity  Risk Reduction Factors:   Positive social support and Positive therapeutic relationship  Continued Clinical Symptoms:  Previous Psychiatric Diagnoses and Treatments  Cognitive Features That Contribute To Risk:  None    Suicide Risk:  Minimal: No identifiable suicidal ideation.  Patients presenting with no risk factors but with morbid ruminations; may be classified as minimal risk based on the severity of the depressive symptoms    Plan Of Care/Follow-up recommendations:  Activity:  no restrictions Diet:  regular Tests:  as needed Other:  follow up with aftercare  Teodor Prater, MD 01/20/2017, 9:44 AM

## 2017-04-26 ENCOUNTER — Encounter (HOSPITAL_COMMUNITY): Payer: Self-pay | Admitting: Emergency Medicine

## 2017-04-26 DIAGNOSIS — R4589 Other symptoms and signs involving emotional state: Secondary | ICD-10-CM | POA: Insufficient documentation

## 2017-04-26 DIAGNOSIS — F4310 Post-traumatic stress disorder, unspecified: Secondary | ICD-10-CM | POA: Diagnosis not present

## 2017-04-26 DIAGNOSIS — R45850 Homicidal ideations: Secondary | ICD-10-CM | POA: Insufficient documentation

## 2017-04-26 DIAGNOSIS — Z79899 Other long term (current) drug therapy: Secondary | ICD-10-CM | POA: Insufficient documentation

## 2017-04-26 DIAGNOSIS — F259 Schizoaffective disorder, unspecified: Secondary | ICD-10-CM | POA: Insufficient documentation

## 2017-04-26 DIAGNOSIS — J45909 Unspecified asthma, uncomplicated: Secondary | ICD-10-CM | POA: Diagnosis not present

## 2017-04-26 DIAGNOSIS — R45851 Suicidal ideations: Secondary | ICD-10-CM | POA: Diagnosis present

## 2017-04-26 DIAGNOSIS — F17210 Nicotine dependence, cigarettes, uncomplicated: Secondary | ICD-10-CM | POA: Diagnosis not present

## 2017-04-26 LAB — COMPREHENSIVE METABOLIC PANEL
ALT: 26 U/L (ref 17–63)
AST: 36 U/L (ref 15–41)
Albumin: 3.8 g/dL (ref 3.5–5.0)
Alkaline Phosphatase: 76 U/L (ref 38–126)
Anion gap: 11 (ref 5–15)
BILIRUBIN TOTAL: 0.6 mg/dL (ref 0.3–1.2)
BUN: 6 mg/dL (ref 6–20)
CHLORIDE: 103 mmol/L (ref 101–111)
CO2: 22 mmol/L (ref 22–32)
CREATININE: 0.89 mg/dL (ref 0.61–1.24)
Calcium: 8.8 mg/dL — ABNORMAL LOW (ref 8.9–10.3)
GFR calc Af Amer: >60 mL/min (ref >60)
GLUCOSE: 126 mg/dL — AB (ref 65–99)
Potassium: 3.6 mmol/L (ref 3.5–5.1)
Sodium: 136 mmol/L (ref 135–145)
TOTAL PROTEIN: 7.3 g/dL (ref 6.5–8.1)

## 2017-04-26 LAB — RAPID URINE DRUG SCREEN, HOSP PERFORMED
AMPHETAMINES: NOT DETECTED
BARBITURATES: NOT DETECTED
Benzodiazepines: NOT DETECTED
Cocaine: NOT DETECTED
Opiates: NOT DETECTED
TETRAHYDROCANNABINOL: NOT DETECTED

## 2017-04-26 LAB — CBC
HEMATOCRIT: 42.7 % (ref 39.0–52.0)
Hemoglobin: 14.4 g/dL (ref 13.0–17.0)
MCH: 30.3 pg (ref 26.0–34.0)
MCHC: 33.7 g/dL (ref 30.0–36.0)
MCV: 89.7 fL (ref 78.0–100.0)
PLATELETS: 331 10*3/uL (ref 150–400)
RBC: 4.76 MIL/uL (ref 4.22–5.81)
RDW: 13.4 % (ref 11.5–15.5)
WBC: 11.7 10*3/uL — ABNORMAL HIGH (ref 4.0–10.5)

## 2017-04-26 LAB — ACETAMINOPHEN LEVEL: Acetaminophen (Tylenol), Serum: <10 ug/mL — ABNORMAL LOW (ref 10–30)

## 2017-04-26 LAB — SALICYLATE LEVEL: Salicylate Lvl: <7 mg/dL (ref 2.8–30.0)

## 2017-04-26 LAB — ETHANOL

## 2017-04-26 NOTE — ED Triage Notes (Signed)
Pt presents with SI/HI who has recently been seen at [facility] for same and disappointed with care there; pt states is tired of not getting the care he needs and is wanting to "go out with a bang on my 26th birthday! I have googled ways to do it and I aint afraid"

## 2017-04-26 NOTE — ED Notes (Signed)
Sitter present with patient at this time

## 2017-04-26 NOTE — ED Notes (Signed)
Pt changed into wine colored scrubs, belongings placed in white patient belonging bags and one backpack;  Staffing called for sitter, pt wanded by security

## 2017-04-27 ENCOUNTER — Emergency Department (HOSPITAL_COMMUNITY)
Admission: EM | Admit: 2017-04-27 | Discharge: 2017-04-29 | Disposition: A | Discharge status: Home / Self Care | Payer: Medicare (Managed Care) | Attending: Emergency Medicine | Admitting: Emergency Medicine

## 2017-04-27 DIAGNOSIS — R45850 Homicidal ideations: Secondary | ICD-10-CM

## 2017-04-27 DIAGNOSIS — F259 Schizoaffective disorder, unspecified: Secondary | ICD-10-CM | POA: Diagnosis not present

## 2017-04-27 DIAGNOSIS — R4689 Other symptoms and signs involving appearance and behavior: Secondary | ICD-10-CM

## 2017-04-27 DIAGNOSIS — R45851 Suicidal ideations: Secondary | ICD-10-CM

## 2017-04-27 MED ORDER — TRAZODONE HCL 50 MG PO TABS
50.0000 mg | ORAL_TABLET | Freq: Every evening | ORAL | Status: DC | PRN
  Filled 2017-04-27: qty 1

## 2017-04-27 MED ORDER — HYDROXYZINE HCL 50 MG PO TABS
50.0000 mg | ORAL_TABLET | Freq: Two times a day (BID) | ORAL | Status: DC | PRN
  Administered 2017-04-27 – 2017-04-29 (×2): 50 mg via ORAL
  Filled 2017-04-27 (×3): qty 1

## 2017-04-27 MED ORDER — ATORVASTATIN CALCIUM 40 MG PO TABS
40.0000 mg | ORAL_TABLET | Freq: Every day | ORAL | Status: DC
  Administered 2017-04-27 – 2017-04-28 (×2): 40 mg via ORAL
  Filled 2017-04-27 (×3): qty 1

## 2017-04-27 MED ORDER — LEVETIRACETAM 500 MG PO TABS
500.0000 mg | ORAL_TABLET | Freq: Two times a day (BID) | ORAL | Status: DC
  Administered 2017-04-27 – 2017-04-29 (×4): 500 mg via ORAL
  Filled 2017-04-27 (×5): qty 1

## 2017-04-27 MED ORDER — ZIPRASIDONE MESYLATE 20 MG IM SOLR
20.0000 mg | Freq: Once | INTRAMUSCULAR | Status: AC
  Administered 2017-04-27: 20 mg via INTRAMUSCULAR

## 2017-04-27 MED ORDER — LURASIDONE HCL 20 MG PO TABS
20.0000 mg | ORAL_TABLET | Freq: Every day | ORAL | Status: DC
  Administered 2017-04-27 – 2017-04-29 (×2): 20 mg via ORAL
  Filled 2017-04-27 (×3): qty 1

## 2017-04-27 NOTE — ED Notes (Signed)
Ordered Breakfast tray

## 2017-04-27 NOTE — ED Notes (Signed)
TTS in progress, sitters switched d/t excessive racial slurs by patient used towards original sitter. Pt can be heard in hallway telling at monitor

## 2017-04-27 NOTE — ED Notes (Signed)
Pt was on phone at nurses' desk talking to his partner who lives out of state. When pt finished conversation, pt asking when he can leave d/t has court date on 05/09/17. RN asked pt to return to his room d/t on phone giving report to floor RN. Pt yelled at another staff member. Pt returned to his room as asked. RN spoke w/pt. Pt states he rode PART bus from [place] to [place] then came to hospital in an attempt to go to [facility]. States since it was taking so long, he became upset. States he now wants to be d/c'd tomorrow so he can get back to [facility]. Then asks if we can provide transportation for him to get there. States he also has a court date in [place] next month. States was living in shelters in [place] but they and the police are treating him badly now so he wants to go back to [place]. Pt noted to be scratching his left arm and left face - states is d/t he has not showered. Advised pt he may shower after dinner and shift change - voiced understanding. Also voiced understanding that [facility] Counselor will be re-assessing him either later tonight or in am.

## 2017-04-27 NOTE — ED Notes (Signed)
Pt agrees to proceed with care calmly and agrees to not assault staff neither verbally or physically.  Pt released from bilat wrist restraints, provided urinal per his request, beverage at bedside.

## 2017-04-27 NOTE — ED Notes (Signed)
Sitter at bedside.

## 2017-04-27 NOTE — ED Notes (Signed)
Sitter got off at 3am; pt resting quietly with eyes closed; pt close to nurses station and within sight of staff
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
## 2017-04-27 NOTE — ED Notes (Signed)
[employer] here presenting patient with IVC paperwork
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
## 2017-04-27 NOTE — ED Notes (Signed)
Pt ambulatory to [facility] w/Sitter. Pt voided in bathroom then returned to room - sleeping. Medical Clearance Pt Policy form placed on bedside table. Lunch ordered for pt. Pt wearing burgundy scrubs.

## 2017-04-27 NOTE — BH Assessment (Signed)
Attempted to connect to tele-cart 1 several times without success. Spoke to Rockwell Alexandria, RN who said Pt left his room and is trying to exit ED. [name] staff will call TTS when Pt is ready for assessment.   Harlin Rain Patsy Baltimore, LPC, [facility], [facility] Triage Specialist [phone]

## 2017-04-27 NOTE — ED Notes (Signed)
Pt woke and is eating lunch. Pt noted to be eating quickly and messy.

## 2017-04-27 NOTE — Progress Notes (Signed)
Patient has been recommended inpatient treatment and referred to the following facilities: [facility], [place], [place], and [facility].  CSW in disposition will continue to seek placement in the morning.  Melbourne Abts, MSW, LCSWA Clinical social worker in disposition [facility], TTS Office

## 2017-04-27 NOTE — ED Notes (Signed)
Pt on phone at nurses' desk. 

## 2017-04-28 DIAGNOSIS — F259 Schizoaffective disorder, unspecified: Secondary | ICD-10-CM | POA: Diagnosis not present

## 2017-04-28 MED ORDER — HALOPERIDOL LACTATE 5 MG/ML IJ SOLN
5.0000 mg | Freq: Once | INTRAMUSCULAR | Status: AC
  Administered 2017-04-28: 5 mg via INTRAMUSCULAR
  Filled 2017-04-28: qty 1

## 2017-04-28 MED ORDER — NICOTINE 21 MG/24HR TD PT24
21.0000 mg | MEDICATED_PATCH | Freq: Once | TRANSDERMAL | Status: AC
  Administered 2017-04-28: 21 mg via TRANSDERMAL
  Filled 2017-04-28: qty 1

## 2017-04-28 MED ORDER — LORAZEPAM 2 MG/ML IJ SOLN
2.0000 mg | Freq: Once | INTRAMUSCULAR | Status: AC
  Administered 2017-04-28: 2 mg via INTRAMUSCULAR
  Filled 2017-04-28 (×2): qty 1

## 2017-04-28 NOTE — ED Triage Notes (Signed)
TTS  Machine is not functional . [facility] called  To reprot.

## 2017-04-28 NOTE — ED Triage Notes (Signed)
PT at Door shouting the nurses MotherFu...... , The doctors are  Mother fuck.......  Marland Kitchen
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
## 2017-04-28 NOTE — Progress Notes (Signed)
Patient meets criteria for inpatient treatment. CSW faxed referrals to the following inpatient facilities for review:  [facility], [facility], [place], [place] [facility]    TTS will continue to seek bed placement.  Baldo Daub MSW, LCSWA CSW Disposition [phone]

## 2017-04-28 NOTE — ED Triage Notes (Signed)
PT calm sitting in his room

## 2017-04-28 NOTE — ED Triage Notes (Signed)
PT awake  And out of bed to BR.  Pt returned to bed .
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
## 2017-04-28 NOTE — ED Triage Notes (Addendum)
PT. Pacing in room  Continue to cuss at staff . Pt pointed to this Clinical research associate and reported  He ran the show  Not the nurses. TC  from  [facility]  Requesting to have brief conversation with PT.

## 2017-04-28 NOTE — ED Notes (Signed)
Pt informed of upcoming medication. Pt initially refused medication stating that he was upset. Pt expressed that he did not want to medication because someone became upset because he did not understand their accent. Pt states that he wants to leave. States that he is no longer having suicidal thoughts and want to go home. Nurse educated on the importance of taking medications necessary for his health. Pt agreed to take medication.

## 2017-04-28 NOTE — ED Triage Notes (Signed)
PT agitated , Pt physically aggressive  And verbally aggressive . Cussing at  Staff . Pt started  throwing  Things in room and banging  On the walls . Pt out side of room  Shouting and cussing at all staff.

## 2017-04-28 NOTE — ED Notes (Signed)
Pt requesting pysch assessment. Says he was not able to complete assessment due to technical issues. Call to [facility] placed. Informed they received enough information to make a recommendation. Pt updated on recommendation.

## 2017-04-28 NOTE — ED Triage Notes (Addendum)
TC to  [facility] to report to assessment team  Pt wants to talke to the Psychiatrist .

## 2017-04-28 NOTE — ED Triage Notes (Signed)
This writer woke Pt up for 1000 meds . Pt calm and cooperative ,smiling to this Clinical research associate. Pt gave  This Clinical research associate a HUG and  Reported he felt much better after sleeping .

## 2017-04-28 NOTE — ED Notes (Signed)
Breakfast tray ordered 

## 2017-04-29 DIAGNOSIS — F259 Schizoaffective disorder, unspecified: Secondary | ICD-10-CM | POA: Diagnosis not present

## 2017-04-29 NOTE — ED Notes (Signed)
Telepsych being performed. 

## 2017-04-29 NOTE — ED Notes (Signed)
Will administer med when pt awakens.

## 2017-04-29 NOTE — ED Notes (Signed)
Pt ambulatory to shower w/Sitter. 

## 2017-04-29 NOTE — ED Notes (Signed)
Pt ambulatory to nurses' desk asking if Telepsych will be performed d/t becoming anxious.

## 2017-04-29 NOTE — Progress Notes (Signed)
Per Ferne Reus, NP, patient does not meet criteria for inpatient treatment. Patient is recommended for discharge and to follow up outpatient services.     CSW spoke with  Merrily Brittle at [facility] in [place] to refer patient for outpatient psych services and a possible ACTT referral.   Per Rozann Lesches, the patient has been scheduled for a hospital follow up appointment at [facility] in [facility] on Friday, May 02, 2017 (05/02/17) at 8:00am.    Jerrol Banana, RN notified.   Baldo Daub MSW, LCSWA CSW Disposition [phone]

## 2017-04-29 NOTE — ED Notes (Signed)
IVC paperwork rescinded by Dr Judd Lien - faxed to [facility], copy sent to Medical Records, and original placed in folder for [employer]. Pt verbalized understanding of appt w/[facility] - WS - 05/02/17 at 8am.

## 2017-04-29 NOTE — ED Notes (Signed)
No Nicoderm patch applied on 04/29/17.

## 2017-04-29 NOTE — ED Notes (Signed)
Gave patient something to drink patient is resting 

## 2017-04-29 NOTE — ED Notes (Signed)
Pt cooperative and slept all night.

## 2017-04-29 NOTE — ED Notes (Signed)
States wants to be d/c'd so can get back to [place]. States he "can get somebody to give me $2.50 for PART bus on the street". States he has a court date on 05/09/17 that he is not missing. Pt noted to be pacing in room. Denies SI/HI.

## 2017-04-29 NOTE — ED Notes (Signed)
Pt on phone at nurses' desk. 

## 2017-04-29 NOTE — ED Notes (Signed)
States his plan is to go back to [place] - advised pt [facility] SW will send pt resources for that area - pt states he already has resources there. Pt smiling - stating he is happy d/t wants to leave. Denies SI/HI.

## 2017-04-29 NOTE — ED Notes (Signed)
Pt pacing in room - stating he is excited about the possibility of being d/c'd. States he is having a hard time waiting to talk to NP, [facility]. Encouraged pt to please be patient.

## 2017-04-29 NOTE — ED Notes (Signed)
Vistaril given as requested - pt noted to be restless - states wants to leave now. RN requested for pt to please be patient as it may take an hour or 2 for the discharge to be complete. Voiced understanding.
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
## 2017-04-29 NOTE — Discharge Instructions (Signed)
Follow-up with day Loraine Leriche as scheduled, and return to the emergency department if symptoms worsen or change.

## 2022-03-07 ENCOUNTER — Emergency Department (HOSPITAL_COMMUNITY)
Admission: EM | Admit: 2022-03-07 | Discharge: 2022-03-08 | Disposition: A | Discharge status: Other Acute Inpatient Hospital | Payer: Self-pay | Attending: Emergency Medicine | Admitting: Emergency Medicine

## 2022-03-07 DIAGNOSIS — R443 Hallucinations, unspecified: Secondary | ICD-10-CM

## 2022-03-07 DIAGNOSIS — F329 Major depressive disorder, single episode, unspecified: Secondary | ICD-10-CM | POA: Insufficient documentation

## 2022-03-07 DIAGNOSIS — F419 Anxiety disorder, unspecified: Secondary | ICD-10-CM | POA: Insufficient documentation

## 2022-03-07 DIAGNOSIS — R441 Visual hallucinations: Secondary | ICD-10-CM | POA: Insufficient documentation

## 2022-03-07 DIAGNOSIS — D72829 Elevated white blood cell count, unspecified: Secondary | ICD-10-CM | POA: Insufficient documentation

## 2022-03-07 DIAGNOSIS — Z20822 Contact with and (suspected) exposure to covid-19: Secondary | ICD-10-CM | POA: Insufficient documentation

## 2022-03-07 DIAGNOSIS — Z7951 Long term (current) use of inhaled steroids: Secondary | ICD-10-CM | POA: Insufficient documentation

## 2022-03-07 DIAGNOSIS — R45851 Suicidal ideations: Secondary | ICD-10-CM | POA: Insufficient documentation

## 2022-03-07 DIAGNOSIS — J45909 Unspecified asthma, uncomplicated: Secondary | ICD-10-CM | POA: Insufficient documentation

## 2022-03-07 LAB — CBC WITH DIFFERENTIAL/PLATELET
Abs Immature Granulocytes: 0.05 10*3/uL (ref 0.00–0.07)
Basophils Absolute: 0.2 10*3/uL — ABNORMAL HIGH (ref 0.0–0.1)
Basophils Relative: 1 %
Eosinophils Absolute: 0.8 10*3/uL — ABNORMAL HIGH (ref 0.0–0.5)
Eosinophils Relative: 7 %
HCT: 43.9 % (ref 39.0–52.0)
Hemoglobin: 14.8 g/dL (ref 13.0–17.0)
Immature Granulocytes: 0 %
Lymphocytes Relative: 28 %
Lymphs Abs: 3.4 10*3/uL (ref 0.7–4.0)
MCH: 31.5 pg (ref 26.0–34.0)
MCHC: 33.7 g/dL (ref 30.0–36.0)
MCV: 93.4 fL (ref 80.0–100.0)
Monocytes Absolute: 0.8 10*3/uL (ref 0.1–1.0)
Monocytes Relative: 7 %
Neutro Abs: 7 10*3/uL (ref 1.7–7.7)
Neutrophils Relative %: 57 %
Platelets: 345 10*3/uL (ref 150–400)
RBC: 4.7 MIL/uL (ref 4.22–5.81)
RDW: 13.1 % (ref 11.5–15.5)
WBC: 12.2 10*3/uL — ABNORMAL HIGH (ref 4.0–10.5)
nRBC: 0 % (ref 0.0–0.2)

## 2022-03-07 LAB — ETHANOL: Alcohol, Ethyl (B): <10 mg/dL (ref <10)

## 2022-03-07 LAB — BASIC METABOLIC PANEL
Anion gap: 8 (ref 5–15)
BUN: 11 mg/dL (ref 6–20)
CO2: 24 mmol/L (ref 22–32)
Calcium: 8.9 mg/dL (ref 8.9–10.3)
Chloride: 109 mmol/L (ref 98–111)
Creatinine, Ser: 0.73 mg/dL (ref 0.61–1.24)
GFR, Estimated: >60 mL/min (ref >60)
Glucose, Bld: 97 mg/dL (ref 70–99)
Potassium: 3.8 mmol/L (ref 3.5–5.1)
Sodium: 141 mmol/L (ref 135–145)

## 2022-03-07 MED ORDER — ALBUTEROL SULFATE HFA 108 (90 BASE) MCG/ACT IN AERS: 2.0000 | INHALATION_SPRAY | RESPIRATORY_TRACT | Status: DC | PRN

## 2022-03-07 MED ORDER — HYDROXYZINE HCL 25 MG PO TABS: 25.0000 mg | ORAL_TABLET | Freq: Three times a day (TID) | ORAL | Status: DC | PRN

## 2022-03-07 MED ORDER — LORAZEPAM 1 MG PO TABS
1.0000 mg | ORAL_TABLET | Freq: Once | ORAL | Status: AC
  Administered 2022-03-07: 1 mg via ORAL
  Filled 2022-03-07: qty 1
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
## 2022-03-07 NOTE — ED Provider Notes (Signed)
[facility] Provider [facility]   CSN: [id number] Arrival date & time: 03/07/22  2159     History  Chief Complaint  Patient presents with   Suicidal   Hallucinations    John [facility] is a 31 y.o. male.  Pt is a 30y/o male with hx of schizoaffective, anxiety, asthma, GERD presenting today due to SI and HI.  Patient reports he has had a lot going on recently and he is feeling use by the people who are around him.  He wants to hurt them and kill himself.  He is thought about jumping off the bridge or walking in front of traffic.  He reports he was at [facility] several days ago but at that time was starting to feel better and was discharged.  He was at [facility] but he reports the people there were really starting to irritate him and he became agitated and they made him leave.  He has been using cocaine in the last few days but reports prior to that had been clean.  He is feeling depressed and hopeless.  The history is provided by the patient and medical records.       Home Medications Prior to Admission medications   Medication Sig Start Date End Date Taking? Authorizing Provider  atorvastatin (LIPITOR) 40 MG tablet Take 1 tablet (40 mg total) by mouth daily at 6 PM. Patient not taking: Reported on 04/27/2017 01/20/17   Adonis Brook, NP  traZODone (DESYREL) 50 MG tablet Take 1 tablet (50 mg total) by mouth at bedtime as needed for sleep. Patient not taking: Reported on 04/27/2017 01/20/17   Adonis Brook, NP      Allergies    Buspirone, Divalproex sodium, and Nickel    Review of Systems   Review of Systems  Physical Exam Updated Vital Signs BP (!) 155/102   Pulse 89   Resp 18   Ht 5\' 6"  (1.676 m)   Wt 122 kg   SpO2 93%   BMI 43.42 kg/m  Physical Exam Vitals and nursing note reviewed.  Constitutional:      General: He is not in acute distress.    Appearance: He is well-developed.     Comments: Disheveled  HENT:     Head: Normocephalic  and atraumatic.  Eyes:     Conjunctiva/sclera: Conjunctivae normal.     Pupils: Pupils are equal, round, and reactive to light.  Cardiovascular:     Rate and Rhythm: Normal rate and regular rhythm.     Heart sounds: No murmur heard. Pulmonary:     Effort: Pulmonary effort is normal. No respiratory distress.     Breath sounds: Wheezing present. No rales.  Abdominal:     General: There is no distension.     Palpations: Abdomen is soft.     Tenderness: There is no abdominal tenderness. There is no guarding or rebound.  Musculoskeletal:        General: No tenderness. Normal range of motion.     Cervical back: Normal range of motion and neck supple.  Skin:    General: Skin is warm and dry.     Findings: No erythema or rash.  Neurological:     Mental Status: He is alert and oriented to person, place, and time.  Psychiatric:        Attention and Perception: He perceives visual hallucinations. He does not perceive auditory hallucinations.        Mood and Affect: Mood is depressed. Affect is  flat.        Behavior: Behavior is cooperative.        Thought Content: Thought content includes homicidal and suicidal ideation. Thought content includes suicidal plan.     Comments: Reports he sees shadows     ED Results / Procedures / Treatments   Labs (all labs ordered are listed, but only abnormal results are displayed) Labs Reviewed  CBC WITH DIFFERENTIAL/PLATELET - Abnormal; Notable for the following components:      Result Value   WBC 12.2 (*)    Eosinophils Absolute 0.8 (*)    Basophils Absolute 0.2 (*)    All other components within normal limits  RESP PANEL BY RT-PCR (FLU A&B, COVID) ARPGX2  BASIC METABOLIC PANEL  ETHANOL  RAPID URINE DRUG SCREEN, HOSP PERFORMED    EKG None  Radiology No results found.  Procedures Procedures    Medications Ordered in ED Medications  albuterol (VENTOLIN HFA) 108 (90 Base) MCG/ACT inhaler 2 puff (has no administration in time range)   hydrOXYzine (ATARAX) tablet 25 mg (has no administration in time range)  LORazepam (ATIVAN) tablet 1 mg (1 mg Oral Given 03/07/22 2330)    ED Course/ Medical Decision Making/ A&P                           Medical Decision Making Amount and/or Complexity of Data Reviewed External Data Reviewed: notes.    Details: External ER notes from [facility] Labs: ordered. Decision-making details documented in ED Course.  Risk Prescription drug management.   Patient presenting today requesting psychiatric evaluation for SI and HI.  Patient has history of depression and has been having worsening visual hallucinations.  He does admit to using cocaine in the last few days but prior to that reports he had been clean.  And also has issues with homelessness and does not feel safe with his living situation or being at a shelter.  Patient does have a history of asthma and COPD and is found to be wheezing diffusely today but reports he has not used his inhaler in a while.  We will give albuterol every 4 hours as needed but patient is satting 93% on room air and is not tachypneic or distressed.  Labs are pending but otherwise feel that patient is medically clear for psychiatric evaluation.  Currently patient is voluntary however with his symptoms of SI and HI would need to be made involuntary if he attempts to leave.  11:33 PM I independently interpreted patient's labs and CBC with minimal leukocytosis of 12 but otherwise normal hemoglobin, CMP within normal limits.  Patient is medically clear at this time.  Based on records here he needs a new medication reconciliation before any home meds can be ordered.  He was given hydroxyzine 3 times daily as needed.        Final Clinical Impression(s) / ED Diagnoses Final diagnoses:  Suicidal ideation  Hallucinations    Rx / DC Orders ED Discharge Orders     None         Gwyneth Sprout, MD 03/07/22 2333

## 2022-03-08 ENCOUNTER — Encounter (HOSPITAL_COMMUNITY): Payer: Self-pay | Admitting: Nurse Practitioner

## 2022-03-08 ENCOUNTER — Other Ambulatory Visit: Payer: Self-pay

## 2022-03-08 ENCOUNTER — Inpatient Hospital Stay (HOSPITAL_COMMUNITY)
Admission: AD | Admit: 2022-03-08 | Discharge: 2022-03-13 | DRG: 885 | Disposition: A | Discharge status: Home / Self Care | Payer: Medicare Other | Source: Intra-hospital | Attending: Psychiatry | Admitting: Psychiatry

## 2022-03-08 DIAGNOSIS — F332 Major depressive disorder, recurrent severe without psychotic features: Secondary | ICD-10-CM | POA: Diagnosis present

## 2022-03-08 DIAGNOSIS — Z79899 Other long term (current) drug therapy: Secondary | ICD-10-CM | POA: Diagnosis not present

## 2022-03-08 DIAGNOSIS — F17210 Nicotine dependence, cigarettes, uncomplicated: Secondary | ICD-10-CM | POA: Diagnosis present

## 2022-03-08 DIAGNOSIS — F411 Generalized anxiety disorder: Secondary | ICD-10-CM | POA: Diagnosis present

## 2022-03-08 DIAGNOSIS — F840 Autistic disorder: Secondary | ICD-10-CM | POA: Diagnosis present

## 2022-03-08 DIAGNOSIS — F1290 Cannabis use, unspecified, uncomplicated: Secondary | ICD-10-CM | POA: Diagnosis present

## 2022-03-08 DIAGNOSIS — Z888 Allergy status to other drugs, medicaments and biological substances status: Secondary | ICD-10-CM | POA: Diagnosis not present

## 2022-03-08 DIAGNOSIS — Z87828 Personal history of other (healed) physical injury and trauma: Secondary | ICD-10-CM

## 2022-03-08 DIAGNOSIS — F259 Schizoaffective disorder, unspecified: Secondary | ICD-10-CM | POA: Diagnosis not present

## 2022-03-08 DIAGNOSIS — F250 Schizoaffective disorder, bipolar type: Principal | ICD-10-CM | POA: Diagnosis present

## 2022-03-08 DIAGNOSIS — Z62810 Personal history of physical and sexual abuse in childhood: Secondary | ICD-10-CM | POA: Diagnosis present

## 2022-03-08 DIAGNOSIS — R45851 Suicidal ideations: Secondary | ICD-10-CM | POA: Diagnosis present

## 2022-03-08 DIAGNOSIS — Z5900 Homelessness unspecified: Secondary | ICD-10-CM

## 2022-03-08 DIAGNOSIS — F1590 Other stimulant use, unspecified, uncomplicated: Secondary | ICD-10-CM

## 2022-03-08 DIAGNOSIS — G4700 Insomnia, unspecified: Secondary | ICD-10-CM | POA: Diagnosis present

## 2022-03-08 DIAGNOSIS — F79 Unspecified intellectual disabilities: Secondary | ICD-10-CM | POA: Diagnosis present

## 2022-03-08 DIAGNOSIS — F4310 Post-traumatic stress disorder, unspecified: Secondary | ICD-10-CM | POA: Diagnosis present

## 2022-03-08 DIAGNOSIS — F1510 Other stimulant abuse, uncomplicated: Secondary | ICD-10-CM | POA: Diagnosis present

## 2022-03-08 DIAGNOSIS — Z87820 Personal history of traumatic brain injury: Secondary | ICD-10-CM

## 2022-03-08 DIAGNOSIS — F1420 Cocaine dependence, uncomplicated: Secondary | ICD-10-CM | POA: Diagnosis present

## 2022-03-08 DIAGNOSIS — K219 Gastro-esophageal reflux disease without esophagitis: Secondary | ICD-10-CM | POA: Diagnosis present

## 2022-03-08 DIAGNOSIS — F333 Major depressive disorder, recurrent, severe with psychotic symptoms: Secondary | ICD-10-CM | POA: Diagnosis not present

## 2022-03-08 LAB — RESP PANEL BY RT-PCR (FLU A&B, COVID) ARPGX2
Influenza A by PCR: NEGATIVE
Influenza B by PCR: NEGATIVE
SARS Coronavirus 2 by RT PCR: NEGATIVE

## 2022-03-08 MED ORDER — HYDROXYZINE HCL 25 MG PO TABS
25.0000 mg | ORAL_TABLET | Freq: Three times a day (TID) | ORAL | Status: DC | PRN
  Administered 2022-03-08 – 2022-03-09 (×3): 25 mg via ORAL
  Filled 2022-03-08 (×3): qty 1

## 2022-03-08 MED ORDER — MAGNESIUM HYDROXIDE 400 MG/5ML PO SUSP: 30.0000 mL | Freq: Every day | ORAL | Status: DC | PRN

## 2022-03-08 MED ORDER — TRAZODONE HCL 50 MG PO TABS
50.0000 mg | ORAL_TABLET | Freq: Every evening | ORAL | Status: DC | PRN
  Administered 2022-03-08 – 2022-03-12 (×4): 50 mg via ORAL
  Filled 2022-03-08 (×2): qty 1
  Filled 2022-03-08: qty 7
  Filled 2022-03-08 (×3): qty 1

## 2022-03-08 MED ORDER — ALBUTEROL SULFATE HFA 108 (90 BASE) MCG/ACT IN AERS
2.0000 | INHALATION_SPRAY | RESPIRATORY_TRACT | Status: DC | PRN
  Administered 2022-03-10 – 2022-03-12 (×2): 2 via RESPIRATORY_TRACT
  Filled 2022-03-08: qty 6.7

## 2022-03-08 MED ORDER — ALUM & MAG HYDROXIDE-SIMETH 200-200-20 MG/5ML PO SUSP: 30.0000 mL | ORAL | Status: DC | PRN

## 2022-03-08 NOTE — ED Notes (Signed)
Pt has completed his TTS consult, pt escorted back to treatment bed Hall D, pt given water to drink and a [place] sandwich.

## 2022-03-08 NOTE — Group Note (Signed)
LCSW Group Therapy Note   Group Date: 03/08/2022 Start Time: 1300 End Time: 1400   Type of Therapy and Topic:  Group Therapy: Boundaries  Participation Level:  Did Not Attend  Description of Group: This group will address the use of boundaries in their personal lives. Patients will explore why boundaries are important, the difference between healthy and unhealthy boundaries, and negative and postive outcomes of different boundaries and will look at how boundaries can be crossed.  Patients will be encouraged to identify current boundaries in their own lives and identify what kind of boundary is being set. Facilitators will guide patients in utilizing problem-solving interventions to address and correct types boundaries being used and to address when no boundary is being used. Understanding and applying boundaries will be explored and addressed for obtaining and maintaining a balanced life. Patients will be encouraged to explore ways to assertively make their boundaries and needs known to significant others in their lives, using other group members and facilitator for role play, support, and feedback.  Therapeutic Goals:  1.  Patient will identify areas in their life where setting clear boundaries could be  used to improve their life.  2.  Patient will identify signs/triggers that a boundary is not being respected. 3.  Patient will identify two ways to set boundaries in order to achieve balance in  their lives: 4.  Patient will demonstrate ability to communicate their needs and set boundaries  through discussion and/or role plays  Summary of Patient Progress:   Patient did not attend group despite encouraged participation.   Therapeutic Modalities:   Cognitive Behavioral Therapy Solution-Focused Therapy  Traniyah Hallett W Siana Panameno, LCSWA 03/08/2022  3:47 PM    

## 2022-03-08 NOTE — BH Assessment (Signed)
[facility] Assessment Progress Note   Per Sindy Guadeloupe, NP, this pt requires psychiatric hospitalization at this time.  Danika, RN, [facility] has assigned pt to [facility] Rm 306-1 to the service of Dr Sherron Flemings.  [facility] will be ready to receive pt after 12:00.  Pt has signed Voluntary Admission and Consent for Treatment, as well as Consent to Release Information, and signed forms have been faxed to [facility].  EDP Mancel Bale, MD, Dahlia Byes, NP and pt's nurse, Chrissie Noa, have been notified, and Chrissie Noa agrees to send original paperwork along with pt via [employer], and to call report to [phone].  Doylene Canning, [place] [facility] Coordinator [phone]

## 2022-03-08 NOTE — ED Notes (Signed)
Pt continues to sleep, NAD noted, even RR and unlabored, pt within view of staff for safety monitoring while in hall bed at nurses station, care on going, will continue to monitor. Pt still waiting on TTS consult.

## 2022-03-08 NOTE — ED Notes (Signed)
Pt continues to sleep, NAD noted, even RR and unlabored, pt within view of staff for safety monitoring while in hall bed at nurses station, care on going, will continue to monitor. Pt still waiting on TTS consult.  

## 2022-03-08 NOTE — Progress Notes (Signed)
Patient ID: [facility], male   DOB: [birth year], 31 y.o.   MRN: [id number]  Pt alert and oriented during [facility] admission. Pt denies HI, AVH, and any pain. Pt endorses passive SI and contracts for safety. Pt is calm and cooperative. Education, support, reassurance, and encouragement provided, q15 minute safety checks initiated. Pt's belongings placed behind curtain in search room. Pt denies any concerns at this time. Pt is ambulating on the unit with no issues, and he remains safe on the unit.

## 2022-03-08 NOTE — Progress Notes (Signed)
Patient ID: [facility], male   DOB: Aug 21, 1991, 31 y.o.   MRN: [id number]  Initial Treatment Plan 03/08/2022 4:46 PM Tiquan Bouch [id number]    PATIENT STRESSORS: Financial difficulties   Loss of housing     PATIENT STRENGTHS: Capable of independent living  Supportive family/friends    PATIENT IDENTIFIED PROBLEMS: Suicidal Ideation  Depression  Homelessness  Anger               DISCHARGE CRITERIA:  Improved stabilization in mood, thinking, and/or behavior Reduction of life-threatening or endangering symptoms to within safe limits Verbal commitment to aftercare and medication compliance  PRELIMINARY DISCHARGE PLAN: Outpatient therapy Referrals indicated:  Housing, employment/job skills training  PATIENT/FAMILY INVOLVEMENT: This treatment plan has been presented to and reviewed with the patient, [name], and/or family member.  The patient and family have been given the opportunity to ask questions and make suggestions.  Tania Ade, RN 03/08/2022, 4:46 PM

## 2022-03-08 NOTE — ED Notes (Signed)
From TTS worker "Disposition: Sindy Guadeloupe, NP, recommends inpatient treatment. Disposition SW to secure placement"

## 2022-03-08 NOTE — ED Notes (Signed)
Pt in RM 7 for his TTS consult at this time

## 2022-03-08 NOTE — ED Notes (Signed)
Pt appears to be sleeping, even RR and unlabored, NAD noted, pt within view of staff for safety monitoring while in hall bed at nurses station, care on going, will continue to monitor. Pt waiting on TTS consult.
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
## 2022-03-08 NOTE — ED Notes (Signed)
Pt appears to be sleeping, even RR and unlabored, NAD noted, sitter within view of pt for safety, room secured, care on going, will continue to monitor.  

## 2022-03-09 ENCOUNTER — Encounter (HOSPITAL_COMMUNITY): Payer: Self-pay | Admitting: Nurse Practitioner

## 2022-03-09 DIAGNOSIS — F411 Generalized anxiety disorder: Secondary | ICD-10-CM

## 2022-03-09 DIAGNOSIS — F333 Major depressive disorder, recurrent, severe with psychotic symptoms: Principal | ICD-10-CM

## 2022-03-09 DIAGNOSIS — G4700 Insomnia, unspecified: Secondary | ICD-10-CM

## 2022-03-09 DIAGNOSIS — F1420 Cocaine dependence, uncomplicated: Secondary | ICD-10-CM | POA: Insufficient documentation

## 2022-03-09 LAB — HEPATIC FUNCTION PANEL
ALT: 35 U/L (ref 0–44)
AST: 22 U/L (ref 15–41)
Albumin: 3.9 g/dL (ref 3.5–5.0)
Alkaline Phosphatase: 67 U/L (ref 38–126)
Bilirubin, Direct: <0.1 mg/dL (ref 0.0–0.2)
Total Bilirubin: 0.5 mg/dL (ref 0.3–1.2)
Total Protein: 7.1 g/dL (ref 6.5–8.1)

## 2022-03-09 MED ORDER — HALOPERIDOL LACTATE 5 MG/ML IJ SOLN: 5.0000 mg | Freq: Four times a day (QID) | INTRAMUSCULAR | Status: DC | PRN

## 2022-03-09 MED ORDER — OLANZAPINE 5 MG PO TBDP
ORAL_TABLET | ORAL | Status: AC
  Filled 2022-03-09: qty 1

## 2022-03-09 MED ORDER — HALOPERIDOL 5 MG PO TABS
5.0000 mg | ORAL_TABLET | Freq: Two times a day (BID) | ORAL | Status: DC
  Administered 2022-03-09 – 2022-03-11 (×4): 5 mg via ORAL
  Filled 2022-03-09 (×7): qty 1

## 2022-03-09 MED ORDER — LORAZEPAM 2 MG/ML IJ SOLN: 1.0000 mg | Freq: Four times a day (QID) | INTRAMUSCULAR | Status: DC | PRN

## 2022-03-09 MED ORDER — OLANZAPINE 5 MG PO TBDP
5.0000 mg | ORAL_TABLET | Freq: Three times a day (TID) | ORAL | Status: DC | PRN
  Administered 2022-03-09 – 2022-03-10 (×2): 5 mg via ORAL
  Filled 2022-03-09: qty 1

## 2022-03-09 MED ORDER — NICOTINE POLACRILEX 2 MG MT GUM
CHEWING_GUM | OROMUCOSAL | Status: AC
  Filled 2022-03-09: qty 1

## 2022-03-09 MED ORDER — LORAZEPAM 1 MG PO TABS
1.0000 mg | ORAL_TABLET | ORAL | Status: AC | PRN
  Administered 2022-03-09: 1 mg via ORAL
  Filled 2022-03-09: qty 1

## 2022-03-09 MED ORDER — BENZTROPINE MESYLATE 0.5 MG PO TABS
0.5000 mg | ORAL_TABLET | Freq: Two times a day (BID) | ORAL | Status: DC
  Administered 2022-03-09 – 2022-03-11 (×4): 0.5 mg via ORAL
  Filled 2022-03-09 (×7): qty 1

## 2022-03-09 MED ORDER — NICOTINE POLACRILEX 2 MG MT GUM
2.0000 mg | CHEWING_GUM | OROMUCOSAL | Status: DC | PRN
  Administered 2022-03-09 – 2022-03-13 (×10): 2 mg via ORAL
  Filled 2022-03-09 (×6): qty 1

## 2022-03-09 MED ORDER — DIPHENHYDRAMINE HCL 50 MG/ML IJ SOLN: 50.0000 mg | Freq: Four times a day (QID) | INTRAMUSCULAR | Status: DC | PRN

## 2022-03-09 MED ORDER — ZIPRASIDONE MESYLATE 20 MG IM SOLR: 20.0000 mg | INTRAMUSCULAR | Status: DC | PRN

## 2022-03-09 NOTE — BHH Group Notes (Signed)
.  Psychoeducational Group Note    Date:  7///23 Time: 1300-1400    Purpose of Group: . The group focus' on teaching patients on how to identify their needs and their Life Skills:  Gibson group where two lists are made. What people need and what are things that we do that are unhealthy. The lists are developed by the patients and it is explained that we often do the actions that are not healthy to get our list of needs met.  Goal:: to develop the coping skills needed to get their needs met  Participation Level:  Active  Participation Quality:  Appropriate  Affect:  Appropriate  Cognitive:  Oriented  Insight: Improving  Engagement in Group:  Engaged  Modes of Intervention:  Activity, Discussion, Education, and Support  Additional Comments:  Pt rates his energy at Gibson 0/10. Stayed in the group for about 10 minutes than left  John Gibson
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
## 2022-03-09 NOTE — Progress Notes (Signed)
   03/08/22 2300  Psych Admission Type (Psych Patients Only)  Admission Status Voluntary  Psychosocial Assessment  Patient Complaints Anxiety;Confusion;Depression  Eye Contact Darting  Facial Expression Animated  Affect Anxious  Speech Pressured  Interaction Childlike  Motor Activity Slow  Appearance/Hygiene Poor hygiene  Behavior Characteristics Appropriate to situation  Mood Anxious  Thought Process  Coherency WDL  Content WDL  Delusions None reported or observed  Perception Hallucinations  Hallucination Visual;Auditory  Judgment Limited  Confusion Mild  Danger to Self  Current suicidal ideation? Denies  Danger to Others  Danger to Others None reported or observed

## 2022-03-09 NOTE — Progress Notes (Signed)
D. Pt presented visibly upset and agitated upon initial approach, stating, "I'm tired of this. I want to be discharged." Pt agreeable to medication for agitation. Staff was able to redirect patient by listening and offering support. Pt currently denies SI/HI and AVH .  A. Labs and vitals monitored. Pt given and educated on medications. Pt supported emotionally and encouraged to express concerns and ask questions.   R. Pt remains safe with 15 minute checks. Will continue POC.

## 2022-03-09 NOTE — Progress Notes (Signed)
Pt did not attend AA meeting 

## 2022-03-09 NOTE — Progress Notes (Addendum)
Patient observed to be in a much better mood this afternoon, but appears restless, as if he's experiencing akathisia, stated, "I feel like my bones are wanting to walk right out of my body."

## 2022-03-09 NOTE — [facility] Group Notes (Signed)
The focus of this group is to help patients review their daily goal of treatment and discuss progress on daily workbooks.   

## 2022-03-09 NOTE — [facility] Group Notes (Signed)
.  Psychoeducational Group Note  Date: 03/09/2022 Time: 0900-1000    Goal Setting   Purpose of Group: This group helps to provide patients with the steps of setting a goal that is specific, measurable, attainable, realistic and time specific. A discussion on how we keep ourselves stuck with negative self talk. Homework given for Patients to write 30 positive attributes about themselves.    Participation Level:  did not attend Trudi Morgenthaler A 
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
## 2022-03-09 NOTE — Progress Notes (Signed)
EKG results placed on the outside of patient's shadow chart   Normal sinus rhythm  Normal ECG  QT/ Qtc-Baz       404/406 ms
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
## 2022-03-09 NOTE — Group Note (Signed)
[facility] LCSW Group Therapy Note  Date/Time:    03/09/2022 10:00-11:00AM  Type of Therapy and Topic:  Group Therapy:  Shame and its Impact on My Life  Participation Level:  Did Not Attend   Description of Group:  The focus of this group was to examine our tendency to be hyper-critical of self and how this leads to feelings of worthlessness, hopelessness, and shame.  Patients were guided to the concept that shame is universal and is worsened by being kept hidden, but improved by being revealed.  We discussed how feeling unworthy is the result of shame and discussed the differences between guilt  ("I did something bad" "I made a mistake" "I did something stupid") and shame ("I am bad" "I am a mistake" "I am stupid") .  We discussed that feelings are not necessarily based in facts.  We also talked about what happens when we do something to numb our negative feelings and how that actually numbs our positive feelings at the same time.  Therapeutic Goals Identify statements patients automatically say to themselves, "I'll be worthy when...."  Examine how this is unkind to ourselves because it indicates we cannot be worthy until some far-reaching, possibly even unreachable, goal is achieved. Talk about the frequent use of unhealthy coping skills used when feeling unworthy Allow patients to discuss their shame out loud in order to reduce its power over them  Summary of Patient Progress: Did not attend group.  Therapeutic Modalities Processing  Ambrose Mantle, LCSW 03/09/2022, 11:22 AM

## 2022-03-10 DIAGNOSIS — F333 Major depressive disorder, recurrent, severe with psychotic symptoms: Secondary | ICD-10-CM

## 2022-03-10 LAB — URINALYSIS, COMPLETE (UACMP) WITH MICROSCOPIC
Bacteria, UA: NONE SEEN
Bilirubin Urine: NEGATIVE
Glucose, UA: NEGATIVE mg/dL
Hgb urine dipstick: NEGATIVE
Ketones, ur: NEGATIVE mg/dL
Leukocytes,Ua: NEGATIVE
Nitrite: NEGATIVE
Protein, ur: NEGATIVE mg/dL
Specific Gravity, Urine: 1.004 — ABNORMAL LOW (ref 1.005–1.030)
pH: 6 (ref 5.0–8.0)

## 2022-03-10 LAB — CBC
HCT: 44.4 % (ref 39.0–52.0)
Hemoglobin: 14.7 g/dL (ref 13.0–17.0)
MCH: 32 pg (ref 26.0–34.0)
MCHC: 33.1 g/dL (ref 30.0–36.0)
MCV: 96.5 fL (ref 80.0–100.0)
Platelets: 286 10*3/uL (ref 150–400)
RBC: 4.6 MIL/uL (ref 4.22–5.81)
RDW: 13.2 % (ref 11.5–15.5)
WBC: 10.9 10*3/uL — ABNORMAL HIGH (ref 4.0–10.5)
nRBC: 0 % (ref 0.0–0.2)

## 2022-03-10 LAB — LIPID PANEL
Cholesterol: 149 mg/dL (ref 0–200)
HDL: 28 mg/dL — ABNORMAL LOW (ref >40)
LDL Cholesterol: 97 mg/dL (ref 0–99)
Total CHOL/HDL Ratio: 5.3 RATIO
Triglycerides: 120 mg/dL (ref <150)
VLDL: 24 mg/dL (ref 0–40)

## 2022-03-10 LAB — TSH: TSH: 1.406 u[IU]/mL (ref 0.350–4.500)

## 2022-03-10 MED ORDER — HYDROXYZINE HCL 50 MG PO TABS
50.0000 mg | ORAL_TABLET | Freq: Three times a day (TID) | ORAL | Status: DC | PRN
  Administered 2022-03-10 – 2022-03-13 (×6): 50 mg via ORAL
  Filled 2022-03-10: qty 10
  Filled 2022-03-10 (×6): qty 1

## 2022-03-10 MED ORDER — SERTRALINE HCL 50 MG PO TABS
50.0000 mg | ORAL_TABLET | Freq: Every day | ORAL | Status: DC
  Administered 2022-03-10 – 2022-03-13 (×4): 50 mg via ORAL
  Filled 2022-03-10: qty 7
  Filled 2022-03-10 (×6): qty 1

## 2022-03-10 NOTE — Progress Notes (Signed)
Pt did not attend wrap up group tonight. 
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
## 2022-03-10 NOTE — Progress Notes (Signed)
[facility] MD Progress Note  03/10/2022 12:33 PM John Gibson  MRN:  [id number]   Reason for Admission:   John Gibson is a 31 y/o male with hx of schizoaffective, anxiety, asthma, GERD who presented to the [facility] ED with complaints of +SI and +HI. He reported plans to kill himself by running into traffic, cutting himself, overdosing or jumping off a bridge. He reported being homicidal towards people around him who use him for his money.The patient is currently on Hospital Day 2.   Chart Review from last 24 hours:  The patient's chart was reviewed and nursing notes were reviewed. The patient's case was discussed in multidisciplinary team meeting. Per MAR as needed Zyprexa was given yesterday morning for agitation, as needed Atarax was given this morning for anxiety, patient is compliant with medications on the unit, no episodes of agitation reported, staff report patient to have improved irritability but occasional anxiety.  Information Obtained Today During Patient Interview: The patient was seen and evaluated on the unit. On assessment today the patient reports feeling better this morning less irritable but continues to report anxiety and reports Vistaril 50 mg helps him better for anxiety and irritability.  He denies SI HI or AVH when asked but continues to report feeling hopeless and depressed he reports history of good response to Zoloft in the past and agrees to restarting it here.  He denies side effect to current medications and does not demonstrate any sign of EPS or akathisia when examined.  Primarily patient was evaluated lying down in bed where he was rested calmly with no irritability noted, I later spoke to the patient in the hallway he did seem to be anxious and restless but appropriate and linear in general.   Sleep  Sleep:Sleep: Poor   Principal Problem: MDD (major depressive disorder), recurrent, severe, with psychosis (HCC) Diagnosis: Principal Problem:   MDD (major depressive  disorder), recurrent, severe, with psychosis (HCC) Active Problems:   Anxiety state   Cocaine dependence (HCC)   Insomnia    Past Psychiatric History: Refer to H&P  Past Medical History:  Past Medical History:  Diagnosis Date   Anxiety    Arthritis    Asthma    GERD (gastroesophageal reflux disease)    IBS   Hemorrhoids    IBS (irritable bowel syndrome)    PTSD (post-traumatic stress disorder)    Schizoaffective disorder (HCC)    Seizures (HCC)    pt stated had a seizure 2 months ago and as child, no documentation history or current treatement information    Past Surgical History:  Procedure Laterality Date   arm surgery     extraction of wisdom teeth     MOUTH SURGERY     Family History:  Family History  Problem Relation Age of Onset   Mental illness Neg Hx    Family Psychiatric  History: Refer to H&P Social History: Refer to H&P  Sleep: Fair  Appetite:  Fair  Current Medications: Current Facility-Administered Medications  Medication Dose Route Frequency Provider Last Rate Last Admin   albuterol (VENTOLIN HFA) 108 (90 Base) MCG/ACT inhaler 2 puff  2 puff Inhalation Q4H PRN Dahlia Byes C, NP       alum & mag hydroxide-simeth (MAALOX/MYLANTA) 200-200-20 MG/5ML suspension 30 mL  30 mL Oral Q4H PRN Welford Roche, Josephine C, NP       benztropine (COGENTIN) tablet 0.5 mg  0.5 mg Oral BID Nkwenti, Doris, NP   0.5 mg at 03/10/22 0900  haloperidol (HALDOL) tablet 5 mg  5 mg Oral BID Starleen Blue, NP   5 mg at 03/10/22 0900   hydrOXYzine (ATARAX) tablet 50 mg  50 mg Oral TID PRN Sarita Bottom, MD   50 mg at 03/10/22 1119   magnesium hydroxide (MILK OF MAGNESIA) suspension 30 mL  30 mL Oral Daily PRN Dahlia Byes C, NP       nicotine polacrilex (NICORETTE) gum 2 mg  2 mg Oral PRN Starleen Blue, NP   2 mg at 03/10/22 1120   OLANZapine zydis (ZYPREXA) disintegrating tablet 5 mg  5 mg Oral Q8H PRN Starleen Blue, NP   5 mg at 03/09/22 1000   And   ziprasidone  (GEODON) injection 20 mg  20 mg Intramuscular PRN Starleen Blue, NP       sertraline (ZOLOFT) tablet 50 mg  50 mg Oral Daily Nadalyn Deringer, MD   50 mg at 03/10/22 1048   traZODone (DESYREL) tablet 50 mg  50 mg Oral QHS PRN Earney Navy, NP   50 mg at 03/08/22 2152    Lab Results:  Results for orders placed or performed during the hospital encounter of 03/08/22 (from the past 48 hour(s))  Hepatic function panel     Status: None   Collection Time: 03/09/22  6:21 PM  Result Value Ref Range   Total Protein 7.1 6.5 - 8.1 g/dL   Albumin 3.9 3.5 - 5.0 g/dL   AST 22 15 - 41 U/L   ALT 35 0 - 44 U/L   Alkaline Phosphatase 67 38 - 126 U/L   Total Bilirubin 0.5 0.3 - 1.2 mg/dL   Bilirubin, Direct <1.5 0.0 - 0.2 mg/dL   Indirect Bilirubin NOT CALCULATED 0.3 - 0.9 mg/dL    Comment: Performed at [facility], [address], [place], [place] [address]  CBC     Status: Abnormal   Collection Time: 03/10/22  6:48 AM  Result Value Ref Range   WBC 10.9 (H) 4.0 - 10.5 K/uL   RBC 4.60 4.22 - 5.81 MIL/uL   Hemoglobin 14.7 13.0 - 17.0 g/dL   HCT 07.3 71.0 - 62.6 %   MCV 96.5 80.0 - 100.0 fL   MCH 32.0 26.0 - 34.0 pg   MCHC 33.1 30.0 - 36.0 g/dL   RDW 94.8 54.6 - 27.0 %   Platelets 286 150 - 400 K/uL   nRBC 0.0 0.0 - 0.2 %    Comment: Performed at [facility], [address] [place], [place] [address]  Lipid panel     Status: Abnormal   Collection Time: 03/10/22  6:48 AM  Result Value Ref Range   Cholesterol 149 0 - 200 mg/dL   Triglycerides 381 <829 mg/dL   HDL 28 (L) >93 mg/dL   Total CHOL/HDL Ratio 5.3 RATIO   VLDL 24 0 - 40 mg/dL   LDL Cholesterol 97 0 - 99 mg/dL    Comment:        Total Cholesterol/HDL:CHD Risk Coronary Heart Disease Risk Table                     Men   Women  1/2 Average Risk   3.4   3.3  Average Risk       5.0   4.4  2 X Average Risk   9.6   7.1  3 X Average Risk  23.4   11.0        Use the calculated  Patient  Ratio above and the CHD Risk Table to determine the patient's CHD Risk.        ATP III CLASSIFICATION (LDL):  <100     mg/dL   Optimal  100-129  mg/dL   Near or Above                    Optimal  130-159  mg/dL   Borderline  160-189  mg/dL   High  >190     mg/dL   Very High Performed at [facility] [address] [place], [place] [address]   TSH     Status: None   Collection Time: 03/10/22  7:12 AM  Result Value Ref Range   TSH 1.406 0.350 - 4.500 uIU/mL    Comment: Performed by a 3rd Generation assay with a functional sensitivity of <=0.01 uIU/mL. Performed at [facility], [place] [address] [place], [place] [address]     Blood Alcohol level:  Lab Results  Component Value Date   ETH <10 03/07/2022   ETH <5 123456    Metabolic Disorder Labs: Lab Results  Component Value Date   HGBA1C 4.9 01/16/2017   MPG 94 01/16/2017   Lab Results  Component Value Date   PROLACTIN 47.6 (H) 01/16/2017   Lab Results  Component Value Date   CHOL 149 03/10/2022   TRIG 120 03/10/2022   HDL 28 (L) 03/10/2022   CHOLHDL 5.3 03/10/2022   VLDL 24 03/10/2022   LDLCALC 97 03/10/2022   LDLCALC 139 (H) 01/16/2017    Physical Findings: AIMS:  , ,  ,  ,    CIWA:    COWS:     Musculoskeletal: Strength & Muscle Tone: within normal limits Gait & Station: normal Patient leans: N/A  Psychiatric Specialty Exam:  General Appearance: appears at stated age, fairly dressed, unkempt Behavior: Pleasant and cooperative  Psychomotor Activity:No psychomotor agitation or retardation noted, some restlessness noted  Eye Contact: Fair yet limited Speech: Decreased amount otherwise within normal limits   Mood: Moderately dysphoric Affect: Restricted sad affect  Thought Process: linear, goal directed yet concrete Descriptions of Associations: intact Thought Content: Hallucinations: Denies AH, VH, does not appear responding to stimuli Delusions: No  paranoia  Suicidal Thoughts: Denies SI, intention, plan  Homicidal Thoughts: Denies HI, intention, plan   Alertness/Orientation: Alert, oriented x3  Insight: limited  Judgment: limited   Memory: Limited  Executive Functions  Concentration: Easily distracted   Attention Span: Limited Recall: Limited Fund of Knowledge: Limited   Assets  Assets:Communication Skills    Physical Exam: Physical Exam ROS Blood pressure (!) 111/59, pulse 74, temperature 98.1 F (36.7 C), temperature source Oral, resp. rate 18, height 5' 6.5" (1.689 m), weight 121.3 kg, SpO2 95 %. Body mass index is 42.51 kg/m.   Treatment Plan Summary:  ASSESSMENT:  Diagnoses / Active Problems: Principal Problem: MDD (major depressive disorder), recurrent, severe, with psychosis ([place]) Diagnosis: Principal Problem:   MDD (major depressive disorder), recurrent, severe, with psychosis ([place]) Active Problems:   Anxiety state   Cocaine dependence ([place])   Insomnia   PLAN:  Medications -Continue Haldol 5 mg BID for mood stabilization -Continue Cogentin 0.5 mg BID for EPS prophylaxis -Continue Trazodone 5 mg nightly PRN for insomnia -Start Agitation protocol as per MAR (Zyprexa Zydis/Ativan/Geodon PRN) -Continue Albuterol Inhaler PRN for wheezing/SOB Titrate Atarax to 50 mg as needed for anxiety Start Zoloft 50 mg daily for depression, history of good response Continue to  monitor restlessness, patient may benefit from small dose of benzodiazepine scheduled twice daily, will follow.    Lab work reviewed, no significant abnormalities noted, EKG 7/1 QTc 406  Other PRNS -Continue Tylenol 650 mg every 6 hours PRN for mild pain -Continue Maalox 30 mg every 4 hrs PRN for indigestion -Continue Milk of Magnesia as needed every 6 hrs for constipation   Discharge Planning: Social work and case management to assist with discharge planning and identification of hospital follow-up needs prior to  discharge Estimated LOS: 5-7 days Discharge Concerns: Need to establish a safety plan; Medication compliance and effectiveness Discharge Goals: Return home with outpatient referrals for mental health follow-up including medication management/psychotherapy      Total Time Spent in Direct Patient Care:  I personally spent 35 minutes on the unit in direct patient care. The direct patient care time included face-to-face time with the patient, reviewing the patient's chart, communicating with other professionals, and coordinating care. Greater than 50% of this time was spent in counseling or coordinating care with the patient regarding goals of hospitalization, psycho-education, and discharge planning needs.   Kamyla Olejnik Winfred Leeds, MD 03/10/2022, 12:33 PM

## 2022-03-10 NOTE — Group Note (Signed)
[facility] LCSW Group Therapy Note  03/10/2022  10:00-11:00AM  Type of Therapy and Topic:  Group Therapy:  Adding Supports Including Myself  Participation Level:  None   Description of Group:  Patients in this group were introduced to the differences between healthy supports and unheathy supports.  This led to a lengthy and emotional discussion about how to set boundaries, particularly with family members.  Many in group expressed that they put other people before themselves.  The group discussed that this not only hurts them, but ultimately ends up hurting the people they want to help.  Examples were given about how to set boundaries one at a time rather than merely cutting people off.  A song entitled "My Own Hero" was played.  A group discussion ensued in which patients stated they could relate to the song and it inspired them to realize they have be willing to help themselves in order to succeed, because other people cannot achieve sobriety or stability for them.  We discussed adding a variety of healthy supports to address the various needs in our lives.    Therapeutic Goals: 1)  demonstrate the importance of being a part of one's own support system by asking for and accepting help 2)  discuss reasons people in one's life may eventually be unable to be continually supportive  3)  identify the patient's current support system and   4)  elicit commitments to add healthy supports and to become more conscious of being self-supportive   Summary of Patient Progress:  The patient came to group late and stayed only a few minutes.  He then left and did not come in again until he heard the song play, then he did enter the room for the group ending.  Therapeutic Modalities:   Motivational Interviewing Activity  Lynnell Chad

## 2022-03-10 NOTE — Plan of Care (Signed)
  Problem: Education: Goal: Knowledge of [facility] Education information/materials will improve Outcome: Progressing Goal: Emotional status will improve Outcome: Progressing Goal: Mental status will improve Outcome: Progressing Goal: Verbalization of understanding the information provided will improve Outcome: Progressing   Problem: Activity: Goal: Interest or engagement in activities will improve Outcome: Progressing Goal: Sleeping patterns will improve Outcome: Progressing   Problem: Coping: Goal: Ability to verbalize frustrations and anger appropriately will improve Outcome: Progressing Goal: Ability to demonstrate self-control will improve Outcome: Progressing   Problem: Health Behavior/Discharge Planning: Goal: Identification of resources available to assist in meeting health care needs will improve Outcome: Progressing Goal: Compliance with treatment plan for underlying cause of condition will improve Outcome: Progressing   Problem: Physical Regulation: Goal: Ability to maintain clinical measurements within normal limits will improve Outcome: Progressing   Problem: Safety: Goal: Periods of time without injury will increase Outcome: Progressing   Problem: Education: Goal: Ability to make informed decisions regarding treatment will improve Outcome: Progressing   Problem: Coping: Goal: Coping ability will improve Outcome: Progressing   Problem: Health Behavior/Discharge Planning: Goal: Identification of resources available to assist in meeting health care needs will improve Outcome: Progressing   Problem: Medication: Goal: Compliance with prescribed medication regimen will improve Outcome: Progressing   Problem: Self-Concept: Goal: Ability to disclose and discuss suicidal ideas will improve Outcome: Progressing Goal: Will verbalize positive feelings about self Outcome: Progressing   Problem: Education: Goal: Ability to state activities that  reduce stress will improve Outcome: Progressing   Problem: Coping: Goal: Ability to identify and develop effective coping behavior will improve Outcome: Progressing   Problem: Self-Concept: Goal: Ability to identify factors that promote anxiety will improve Outcome: Progressing Goal: Level of anxiety will decrease Outcome: Progressing Goal: Ability to modify response to factors that promote anxiety will improve Outcome: Progressing   Problem: Education: Goal: Utilization of techniques to improve thought processes will improve Outcome: Progressing Goal: Knowledge of the prescribed therapeutic regimen will improve Outcome: Progressing   Problem: Activity: Goal: Interest or engagement in leisure activities will improve Outcome: Progressing Goal: Imbalance in normal sleep/wake cycle will improve Outcome: Progressing   Problem: Coping: Goal: Coping ability will improve Outcome: Progressing Goal: Will verbalize feelings Outcome: Progressing   Problem: Health Behavior/Discharge Planning: Goal: Ability to make decisions will improve Outcome: Progressing Goal: Compliance with therapeutic regimen will improve Outcome: Progressing   Problem: Role Relationship: Goal: Will demonstrate positive changes in social behaviors and relationships Outcome: Progressing   Problem: Safety: Goal: Ability to disclose and discuss suicidal ideas will improve Outcome: Progressing Goal: Ability to identify and utilize support systems that promote safety will improve Outcome: Progressing   Problem: Self-Concept: Goal: Will verbalize positive feelings about self Outcome: Progressing Goal: Level of anxiety will decrease Outcome: Progressing   

## 2022-03-10 NOTE — [facility] Suicide Risk Assessment (Signed)
[facility] INPATIENT:  Family/Significant Other Suicide Prevention Education  Suicide Prevention Education:  Contact Attempts: Teena Irani, sister, [phone] has been identified by the patient as the family member/significant other with whom the patient will be residing, and identified as the person(s) who will aid the patient in the event of a mental health crisis.  With written consent from the patient, two attempts were made to provide suicide prevention education, prior to and/or following the patient's discharge.  We were unsuccessful in providing suicide prevention education.  A suicide education pamphlet was given to the patient to share with family/significant other.  Date and time of first attempt:03/10/2022 3:59 PM Date and time of second attempt: CSW will make additional attempts to reach out to sister.   Corky Crafts 03/10/2022, 3:59 PM

## 2022-03-10 NOTE — [facility] Group Notes (Signed)
Adult Psychoeducational Group  Date:  03/10/2022 Time:  1300-1400  Group Topic/Focus: Continuation of the group from Saturday. Looking at the lists that were created and talking about what needs to be done with the homework of 30 positives about themselves.                                     Talking about taking their power back and helping themselves to develop a positive self esteem.      Participation Quality:  did not attend  Eleanor Dimichele A  

## 2022-03-10 NOTE — Progress Notes (Signed)
Patient alert and oriented x4. Patient at the nurses desk, talking to staff. Patient denies any current SI/HI and denies any current AVH. At times patient does not understand social cues and gets upset when people "look at him wrong". Verbally redirected patient with stress management techniques and coping skills. Patient tolerates redirection appropriately. Takes meds as ordered. No pain at present. Will continue q74min checks and provide support as needed.

## 2022-03-10 NOTE — [facility] Group Notes (Signed)
Adult Psychoeducational Group Note Date:  03/10/2022 Time:  0900-1045 Group Topic/Focus: PROGRESSIVE RELAXATION. A group where deep breathing is taught and tensing and relaxation muscle groups is used. Imagery is used as well.  Pts are asked to imagine 3 pillars that hold them up when they are not able to hold themselves up and to share that with the group.  Participation Level:  Active  Participation Quality:  Appropriate  Affect:  Appropriate  Cognitive:  Oriented  Insight: Improving  Engagement in Group:  Engaged  Modes of Intervention:  Activity, Discussion, Education, and Support  Additional Comments:  Rates her energy at a 7/10. Was removed from the group by the doctor.  Dione Housekeeper

## 2022-03-10 NOTE — Progress Notes (Signed)
   03/10/22 0000  Psych Admission Type (Psych Patients Only)  Admission Status Voluntary  Psychosocial Assessment  Patient Complaints Anxiety;Restlessness  Eye Contact Fair  Facial Expression Animated  Affect Anxious  Speech Pressured  Interaction Childlike  Motor Activity Restless  Appearance/Hygiene Poor hygiene  Behavior Characteristics Anxious;Hyperactive  Mood Anxious  Thought Process  Coherency WDL  Content WDL  Delusions None reported or observed  Perception Hallucinations  Hallucination None reported or observed  Judgment Limited  Confusion None  Danger to Self  Current suicidal ideation? Denies  Danger to Others  Danger to Others None reported or observed   D: Patient observed in the milieu restless and hyperactive. Pt requested IM injection to calm him down. Pt reminded of PRN given. Encourage pt to rest.  A: Support and encouragement provided to rest.  R: Patient remains safe on the unit. Will continue to monitor for safety and stability.

## 2022-03-11 ENCOUNTER — Encounter (HOSPITAL_COMMUNITY): Payer: Self-pay

## 2022-03-11 LAB — HEMOGLOBIN A1C
Hgb A1c MFr Bld: 5 % (ref 4.8–5.6)
Mean Plasma Glucose: 96.8 mg/dL

## 2022-03-11 MED ORDER — ZIPRASIDONE MESYLATE 20 MG IM SOLR: 20.0000 mg | Freq: Three times a day (TID) | INTRAMUSCULAR | Status: DC | PRN

## 2022-03-11 MED ORDER — OLANZAPINE 5 MG PO TBDP
5.0000 mg | ORAL_TABLET | Freq: Three times a day (TID) | ORAL | Status: DC | PRN
  Filled 2022-03-11: qty 1

## 2022-03-11 MED ORDER — OLANZAPINE 10 MG PO TABS
10.0000 mg | ORAL_TABLET | Freq: Every day | ORAL | Status: DC
  Administered 2022-03-11 – 2022-03-12 (×2): 10 mg via ORAL
  Filled 2022-03-11 (×6): qty 1

## 2022-03-11 MED ORDER — OLANZAPINE 5 MG PO TABS
5.0000 mg | ORAL_TABLET | Freq: Every day | ORAL | Status: DC
  Administered 2022-03-11 – 2022-03-13 (×3): 5 mg via ORAL
  Filled 2022-03-11 (×6): qty 1

## 2022-03-11 MED ORDER — LORAZEPAM 1 MG PO TABS
1.0000 mg | ORAL_TABLET | Freq: Every day | ORAL | Status: AC
Start: 2022-03-11 — End: 2022-03-11
  Administered 2022-03-11: 1 mg via ORAL
  Filled 2022-03-11: qty 1
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
## 2022-03-11 NOTE — Plan of Care (Signed)
  Problem: Coping: Goal: Ability to verbalize frustrations and anger appropriately will improve Outcome: Progressing Goal: Ability to demonstrate self-control will improve Outcome: Progressing   Problem: Medication: Goal: Compliance with prescribed medication regimen will improve Outcome: Progressing

## 2022-03-11 NOTE — Progress Notes (Signed)
Pt joined AA group.

## 2022-03-11 NOTE — Progress Notes (Signed)
Patient slept for hours after scheduled ativan and zyprexa was given to him. No acute distress noted at this time. Staff will continue to monitor.  03/11/22 0800  Psych Admission Type (Psych Patients Only)  Admission Status Voluntary  Psychosocial Assessment  Patient Complaints Agitation;Anxiety;Irritability  Eye Contact Fair  Facial Expression Anxious  Affect Anxious  Speech Logical/coherent  Interaction Childlike;Attention-seeking  Motor Activity Restless;Pacing  Appearance/Hygiene Unremarkable  Behavior Characteristics Cooperative;Anxious  Mood Anxious  Thought Process  Coherency WDL  Content WDL  Delusions None reported or observed  Perception WDL  Hallucination None reported or observed  Judgment Poor  Confusion None  Danger to Self  Current suicidal ideation? Denies  Danger to Others  Danger to Others None reported or observed

## 2022-03-11 NOTE — Progress Notes (Signed)
Very anxious and pacing floor on my arrival to unit. Request medication to help with anxiety. Has recently received Vistaril.  Accepts hs medication early. Says he likes to go to bed early anyway. Did not attend most of AA group. Reports increased anxiety with being around more people.

## 2022-03-11 NOTE — [facility] Group Notes (Signed)
Spiritual care group on grief and loss facilitated by chaplain Katy Brandom Kerwin, [facility]   Group Goal:   Support / Education around grief and loss   Members engage in facilitated group support and psycho-social education.   Group Description:   Following introductions and group rules, group members engaged in facilitated group dialog and support around topic of loss, with particular support around experiences of loss in their lives. Group Identified types of loss (relationships / self / things) and identified patterns, circumstances, and changes that precipitate losses. Reflected on thoughts / feelings around loss, normalized grief responses, and recognized variety in grief experience. Group noted Worden's four tasks of grief in discussion.   Group drew on Adlerian / Rogerian, narrative, MI,   Patient Progress: Did not attend.  

## 2022-03-11 NOTE — Group Note (Signed)
LCSW Group Therapy Note  Group Date: 03/11/2022 Start Time: 1300 End Time: 1400   Type of Therapy and Topic:  Group Therapy - Healthy vs Unhealthy Coping Skills  Participation Level:  Did Not Attend   Description of Group The focus of this group was to determine what unhealthy coping techniques typically are used by group members and what healthy coping techniques would be helpful in coping with various problems. Patients were guided in becoming aware of the differences between healthy and unhealthy coping techniques. Patients were asked to identify 2-3 healthy coping skills they would like to learn to use more effectively.  Therapeutic Goals Patients learned that coping is what human beings do all day long to deal with various situations in their lives Patients defined and discussed healthy vs unhealthy coping techniques Patients identified their preferred coping techniques and identified whether these were healthy or unhealthy Patients determined 2-3 healthy coping skills they would like to become more familiar with and use more often. Patients provided support and ideas to each other   Summary of Patient Progress:   Patient did not attend group despite encouraged participation.    Therapeutic Modalities Cognitive Behavioral Therapy Motivational Interviewing  Almedia Balls 03/11/2022  3:07 PM

## 2022-03-11 NOTE — BH IP Treatment Plan (Signed)
Interdisciplinary Treatment and Diagnostic Plan Update  03/11/2022 Time of Session: 1000 John Gibson MRN: [id number]  Principal Diagnosis: MDD (major depressive disorder), recurrent, severe, with psychosis (HCC)  Secondary Diagnoses: Principal Problem:   MDD (major depressive disorder), recurrent, severe, with psychosis (HCC) Active Problems:   Anxiety state   Cocaine dependence (HCC)   Insomnia   Current Medications:  Current Facility-Administered Medications  Medication Dose Route Frequency Provider Last Rate Last Admin   albuterol (VENTOLIN HFA) 108 (90 Base) MCG/ACT inhaler 2 puff  2 puff Inhalation Q4H PRN Dahlia Byes C, NP   2 puff at 03/10/22 1709   alum & mag hydroxide-simeth (MAALOX/MYLANTA) 200-200-20 MG/5ML suspension 30 mL  30 mL Oral Q4H PRN Dahlia Byes C, NP       hydrOXYzine (ATARAX) tablet 50 mg  50 mg Oral TID PRN Sarita Bottom, MD   50 mg at 03/10/22 2005   magnesium hydroxide (MILK OF MAGNESIA) suspension 30 mL  30 mL Oral Daily PRN Dahlia Byes C, NP       nicotine polacrilex (NICORETTE) gum 2 mg  2 mg Oral PRN Starleen Blue, NP   2 mg at 03/10/22 2018   OLANZapine (ZYPREXA) tablet 10 mg  10 mg Oral QHS Massengill, Harrold Donath, MD       OLANZapine (ZYPREXA) tablet 5 mg  5 mg Oral Daily Massengill, Nathan, MD   5 mg at 03/11/22 1116   OLANZapine zydis (ZYPREXA) disintegrating tablet 5 mg  5 mg Oral Q8H PRN Massengill, Harrold Donath, MD       And   ziprasidone (GEODON) injection 20 mg  20 mg Intramuscular TID PRN Massengill, Harrold Donath, MD       sertraline (ZOLOFT) tablet 50 mg  50 mg Oral Daily Abbott Pao, Nadir, MD   50 mg at 03/11/22 0910   traZODone (DESYREL) tablet 50 mg  50 mg Oral QHS PRN Earney Navy, NP   50 mg at 03/10/22 2005   PTA Medications: Medications Prior to Admission  Medication Sig Dispense Refill Last Dose   atorvastatin (LIPITOR) 40 MG tablet Take 1 tablet (40 mg total) by mouth daily at 6 PM. (Patient not taking: Reported on 04/27/2017) 30  tablet 0    traZODone (DESYREL) 50 MG tablet Take 1 tablet (50 mg total) by mouth at bedtime as needed for sleep. (Patient not taking: Reported on 04/27/2017) 30 tablet 0     Patient Stressors: Financial difficulties   Loss of housing    Patient Strengths: Capable of independent living  Supportive family/friends   Treatment Modalities: Medication Management, Group therapy, Case management,  1 to 1 session with clinician, Psychoeducation, Recreational therapy.   Physician Treatment Plan for Primary Diagnosis: MDD (major depressive disorder), recurrent, severe, with psychosis (HCC) Long Term Goal(s): Improvement in symptoms so as ready for discharge   Short Term Goals: Ability to identify changes in lifestyle to reduce recurrence of condition will improve Ability to disclose and discuss suicidal ideas Ability to identify and develop effective coping behaviors will improve Ability to maintain clinical measurements within normal limits will improve Compliance with prescribed medications will improve Ability to identify triggers associated with substance abuse/mental health issues will improve  Medication Management: Evaluate patient's response, side effects, and tolerance of medication regimen.  Therapeutic Interventions: 1 to 1 sessions, Unit Group sessions and Medication administration.  Evaluation of Outcomes: Progressing  Physician Treatment Plan for Secondary Diagnosis: Principal Problem:   MDD (major depressive disorder), recurrent, severe, with psychosis (HCC) Active Problems:   Anxiety  state   Cocaine dependence (HCC)   Insomnia  Long Term Goal(s): Improvement in symptoms so as ready for discharge   Short Term Goals: Ability to identify changes in lifestyle to reduce recurrence of condition will improve Ability to disclose and discuss suicidal ideas Ability to identify and develop effective coping behaviors will improve Ability to maintain clinical measurements within  normal limits will improve Compliance with prescribed medications will improve Ability to identify triggers associated with substance abuse/mental health issues will improve     Medication Management: Evaluate patient's response, side effects, and tolerance of medication regimen.  Therapeutic Interventions: 1 to 1 sessions, Unit Group sessions and Medication administration.  Evaluation of Outcomes: Progressing   RN Treatment Plan for Primary Diagnosis: MDD (major depressive disorder), recurrent, severe, with psychosis (HCC) Long Term Goal(s): Knowledge of disease and therapeutic regimen to maintain health will improve  Short Term Goals: Ability to remain free from injury will improve, Ability to verbalize frustration and anger appropriately will improve, Ability to demonstrate self-control, Ability to participate in decision making will improve, Ability to verbalize feelings will improve, Ability to disclose and discuss suicidal ideas, Ability to identify and develop effective coping behaviors will improve, and Compliance with prescribed medications will improve  Medication Management: RN will administer medications as ordered by provider, will assess and evaluate patient's response and provide education to patient for prescribed medication. RN will report any adverse and/or side effects to prescribing provider.  Therapeutic Interventions: 1 on 1 counseling sessions, Psychoeducation, Medication administration, Evaluate responses to treatment, Monitor vital signs and CBGs as ordered, Perform/monitor CIWA, COWS, AIMS and Fall Risk screenings as ordered, Perform wound care treatments as ordered.  Evaluation of Outcomes: Progressing   LCSW Treatment Plan for Primary Diagnosis: MDD (major depressive disorder), recurrent, severe, with psychosis (HCC) Long Term Goal(s): Safe transition to appropriate next level of care at discharge, Engage patient in therapeutic group addressing interpersonal  concerns.  Short Term Goals: Engage patient in aftercare planning with referrals and resources, Increase social support, Increase ability to appropriately verbalize feelings, Increase emotional regulation, Facilitate acceptance of mental health diagnosis and concerns, Facilitate patient progression through stages of change regarding substance use diagnoses and concerns, Identify triggers associated with mental health/substance abuse issues, and Increase skills for wellness and recovery  Therapeutic Interventions: Assess for all discharge needs, 1 to 1 time with Social worker, Explore available resources and support systems, Assess for adequacy in community support network, Educate family and significant other(s) on suicide prevention, Complete Psychosocial Assessment, Interpersonal group therapy.  Evaluation of Outcomes: Progressing   Progress in Treatment: Attending groups: No. Participating in groups: No. Taking medication as prescribed: Yes. Toleration medication: Yes. Family/Significant other contact made: Yes, individual(s) contacted:  SPE completed with  Leretha Dykes, friend, [phone]  and Teena Irani, sister, [phone]. Patient understands diagnosis: Yes. Discussing patient identified problems/goals with staff: Yes. Medical problems stabilized or resolved: Yes. Denies suicidal/homicidal ideation: No. Issues/concerns per patient self-inventory: Yes. Other: none  New problem(s) identified: No, Describe:  none  New Short Term/Long Term Goal(s): Patient to work towards medication management for mood stabilization; elimination of SI thoughts; development of comprehensive mental wellness plan.  Patient Goals:  States "I want to leave today and see if I can get into [facility]."   Discharge Plan or Barriers: Patient is homeless, CSW to have further conversations regarding housing and inpatient SUD treatment.   Reason for Continuation of Hospitalization: Depression Other; describe  `active substance use.   Estimated Length of Stay:  1-7 days  Last 3 [place] Suicide Severity Risk Score: Flowsheet Row [facility] (Current[facility]07[facility]  C-SSRS RISK CATEGORY High Risk High Risk        Scribe for Treatment Team: Almedia Balls 03/11/2022 12:46 PM

## 2022-03-11 NOTE — Progress Notes (Signed)
John Gibson reports good results after receiving night time mediation early. He ask that we please report to M.D. Smiling and thankful. Support and reassurance given. Patient joined end of group.

## 2022-03-12 MED ORDER — METFORMIN HCL ER 500 MG PO TB24
500.0000 mg | ORAL_TABLET | Freq: Every day | ORAL | Status: DC
  Administered 2022-03-13: 500 mg via ORAL
  Filled 2022-03-12 (×2): qty 1
  Filled 2022-03-12: qty 7

## 2022-03-12 MED ORDER — OLANZAPINE 5 MG PO TABS
5.0000 mg | ORAL_TABLET | Freq: Every day | ORAL | Status: DC
Start: 2022-03-12 — End: 2022-03-13
  Administered 2022-03-12: 5 mg via ORAL
  Filled 2022-03-12 (×3): qty 1
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
## 2022-03-12 NOTE — Progress Notes (Signed)
Patient stated he wants trazadone to help with restless legs.  Zyprexa also helps his restless legs.

## 2022-03-12 NOTE — [facility] Group Notes (Signed)
Adult Psychoeducational Group Note  Date:  03/12/2022 Time:  9:49 PM  Group Topic/Focus:  Wrap-Up Group:   The focus of this group is to help patients review their daily goal of treatment and discuss progress on daily workbooks.  Participation Level:  Active  Participation Quality:  Attentive  Affect:  Appropriate  Cognitive:  Alert  Insight: Appropriate  Engagement in Group:  Engaged  Modes of Intervention:  Discussion  Additional Comments:  Patient attended and participated in the Wrap-up group.  Jearl Klinefelter 03/12/2022, 9:49 PM

## 2022-03-12 NOTE — Group Note (Signed)
Date:  03/12/2022 Time:  9:43 AM  Group Topic/Focus:  Orientation:   The focus of this group is to educate the patient on the purpose and policies of crisis stabilization and provide a format to answer questions about their admission.  The group details unit policies and expectations of patients while admitted.    Participation Level:  Active  Participation Quality:  Appropriate  Affect:  Appropriate  Cognitive:  Appropriate  Insight: Appropriate  Engagement in Group:  Engaged  Modes of Intervention:  Discussion  Additional Comments:  Patient wants to be clear on what discharge looks like for his discharge date.   John Gibson 03/12/2022, 9:43 AM

## 2022-03-12 NOTE — Progress Notes (Signed)
   03/12/22 6438  Psych Admission Type (Psych Patients Only)  Admission Status Voluntary  Psychosocial Assessment  Patient Complaints Anxiety;Depression;Restlessness;Worrying  Eye Contact Fair  Facial Expression Anxious;Animated  Affect Anxious;Depressed  Speech Logical/coherent  Interaction Childlike  Motor Activity Fidgety;Pacing  Appearance/Hygiene Unremarkable  Behavior Characteristics Cooperative;Anxious  Mood Anxious;Depressed  Thought Process  Coherency WDL  Content WDL  Delusions WDL  Perception WDL  Hallucination None reported or observed  Judgment Poor  Confusion None  Danger to Self  Current suicidal ideation? Denies  Agreement Not to Harm Self Yes  Description of Agreement verbal  Danger to Others  Danger to Others None reported or observed

## 2022-03-12 NOTE — Plan of Care (Signed)
  Problem: Coping: Goal: Ability to verbalize frustrations and anger appropriately will improve Outcome: Progressing Goal: Ability to demonstrate self-control will improve Outcome: Progressing   Problem: Activity: Goal: Interest or engagement in activities will improve Outcome: Progressing Goal: Sleeping patterns will improve Outcome: Progressing   Problem: Safety: Goal: Periods of time without injury will increase Outcome: Progressing   

## 2022-03-13 DIAGNOSIS — F259 Schizoaffective disorder, unspecified: Secondary | ICD-10-CM

## 2022-03-13 DIAGNOSIS — F1590 Other stimulant use, unspecified, uncomplicated: Secondary | ICD-10-CM

## 2022-03-13 LAB — URINE DRUGS OF ABUSE SCREEN W ALC, ROUTINE (REF LAB)
Amphetamines, Urine: NEGATIVE ng/mL
Barbiturate, Ur: NEGATIVE ng/mL
Benzodiazepine Quant, Ur: NEGATIVE ng/mL
Cannabinoid Quant, Ur: NEGATIVE ng/mL
Cocaine (Metab.): NEGATIVE ng/mL
Ethanol U, Quan: NEGATIVE %
Methadone Screen, Urine: NEGATIVE ng/mL
Opiate Quant, Ur: NEGATIVE ng/mL
Phencyclidine, Ur: NEGATIVE ng/mL
Propoxyphene, Urine: NEGATIVE ng/mL

## 2022-03-13 MED ORDER — NICOTINE POLACRILEX 2 MG MT GUM
2.0000 mg | CHEWING_GUM | OROMUCOSAL | 0 refills | Status: DC | PRN
Start: 2022-03-13 — End: 2022-09-23

## 2022-03-13 MED ORDER — HYDROXYZINE HCL 50 MG PO TABS: 50.0000 mg | ORAL_TABLET | Freq: Three times a day (TID) | ORAL | 0 refills | Status: AC | PRN

## 2022-03-13 MED ORDER — METFORMIN HCL ER 500 MG PO TB24: 500.0000 mg | ORAL_TABLET | Freq: Every day | ORAL | 0 refills | Status: DC

## 2022-03-13 MED ORDER — OLANZAPINE 5 MG PO TABS: 5.0000 mg | ORAL_TABLET | ORAL | 0 refills | Status: AC

## 2022-03-13 MED ORDER — OLANZAPINE 5 MG PO TABS
5.0000 mg | ORAL_TABLET | ORAL | Status: DC
  Filled 2022-03-13 (×2): qty 28

## 2022-03-13 MED ORDER — SERTRALINE HCL 50 MG PO TABS
50.0000 mg | ORAL_TABLET | Freq: Every day | ORAL | 0 refills | Status: DC
Start: 2022-03-13 — End: 2022-09-23

## 2022-03-13 MED ORDER — TRAZODONE HCL 50 MG PO TABS: 50.0000 mg | ORAL_TABLET | Freq: Every evening | ORAL | 0 refills | Status: DC | PRN

## 2022-03-13 MED ORDER — ALBUTEROL SULFATE HFA 108 (90 BASE) MCG/ACT IN AERS
2.0000 | INHALATION_SPRAY | RESPIRATORY_TRACT | Status: AC | PRN
Start: 2022-03-13 — End: ?

## 2022-03-13 MED ORDER — ATORVASTATIN CALCIUM 40 MG PO TABS
40.0000 mg | ORAL_TABLET | Freq: Every day | ORAL | Status: DC
  Filled 2022-03-13 (×2): qty 7

## 2022-03-13 NOTE — Progress Notes (Signed)
Patient ID: John Gibson, male   DOB: 02/17/1991, 31 y.o.   MRN: [id number]   Pt ambulatory, alert, and oriented X4 on and off the unit. Education, support, and encouragement provided. Discharge summary/AVS, prescriptions, medications, and follow up appointments reviewed with pt and a copy of the AVS was given to pt. Suicide prevention resources provided. Pt's belongings returned and belongings sheet signed. Pt denies SI/HI, AVH, pain, or any concerns at this time. Pt discharged to lobby to [facility] taxi to be transported to his destination.
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
## 2022-03-13 NOTE — Progress Notes (Signed)
  [facility] Adult Case Management Discharge Plan :  Will you be returning to the same living situation after discharge:  Yes,  Patient to discharge to [facility] in order to apply for shelters in the [place] area. Patient has been informed this is only a day shelter, patient remains  insistent on discharging.  At discharge, do you have transportation home?: Yes,  Patient provided cab voucher to [facility].  Do you have the ability to pay for your medications: No. Patient has no listed insurance, to be provided with 7 day supply of medication at discharge. CSW has provided patient with clinic information for free/reduced medications.    Release of information consent forms completed and in the chart;  Patient's signature needed at discharge.  Patient to Follow up at:  Follow-up Information     Services, [facility]. Call.   Why: If you would like to attend inpatient substance use treatment, please reach out to this clinic and completed intake assessment. Contact information: [address] [place] [address] [phone]         [facility]. Go on 03/18/2022.   Specialty: Behavioral Health Why: Please present for new patient walk in clinic Mon - Fri at 0745 am. Clinic operates on a first come first serve basis and starts seeing patients at 0800. Contact information: [address] [place] [address] [phone]                Next level of care provider has access to [facility]:yes  Safety Planning and Suicide Prevention discussed: Yes,  SPE Completed with patient and Leretha Dykes, friend, [phone]  and Teena Irani, sister, [phone].   Patient's support agrees to monitor patient for safety, ensure treatment compliance, and alert emergency services should patient require such services.     Has patient been referred to the Quitline?: Yes, faxed on 03/13/2022  Patient has been referred for addiction  treatment: Yes Patient endorses active cocaine use, referred to [facility] for outpatient mental health and substance use counseling. Clinic operates on a new patient walk in clinic, unable to obtain appointment. Information for clinic to include hours of operation noted in discharge instructions, see above.   Corky Crafts, LCSWA 03/13/2022, 9:49 AM

## 2022-03-13 NOTE — Progress Notes (Signed)

## 2022-03-13 NOTE — [facility] Suicide Risk Assessment (Signed)
[facility] Discharge Suicide Risk Assessment   Principal Problem: Schizoaffective disorder [facility]) Discharge Diagnoses: Principal Problem:   Schizoaffective disorder (HCC) Active Problems:   MDD (major depressive disorder), recurrent, severe, with psychosis (HCC)   Anxiety state   Insomnia   Stimulant use disorder  John Gibson is a 31 y/o male with hx of schizoaffective disorder, anxiety, asthma, GERD who presented to the [facility] ED with complaints of suicidal thoughts and homicidal thoughts. He reported plans to kill himself by running into traffic, cutting himself, overdosing or jumping off a bridge. He reported being homicidal towards people around him who use him for his money. The patient reports a history of autism.   HOSPITAL COURSE:  During the patient's hospitalization, patient had extensive initial psychiatric evaluation, and follow-up psychiatric evaluations every day.  Psychiatric diagnoses provided upon initial assessment:  Schizoaffective disorder Autism spectrum disorder Stimulant use disorder (crack cocaine)  Patient's psychiatric medications were adjusted on admission:  -Start Haldol 5 mg BID for mood stabilization -Start Cogentin 0.5 mg BID for EPS prophylaxis -Continue Trazodone 50 mg nightly PRN for insomnia  During the hospitalization, other adjustments were made to the patient's psychiatric medication regimen:  Patient was changed to Zyprexa 5 mg AM / 10 mg QHS for psychotic features Started Zoloft and titrated to 50 mg daily for mood  Patient's care was discussed during the interdisciplinary team meeting every day during the hospitalization.  The patient denied having side effects to prescribed psychiatric medication.  Gradually, patient started adjusting to milieu. The patient was evaluated each day by a clinical provider to ascertain response to treatment. Improvement was noted by the patient's report of decreasing symptoms, improved sleep and appetite,  affect, medication tolerance, behavior, and participation in unit programming.  Patient was asked each day to complete a self inventory noting mood, mental status, pain, new symptoms, anxiety and concerns.    Symptoms were reported as significantly decreased or resolved completely by discharge.   On day of discharge, the patient reports that their mood is stable. The patient denied having suicidal thoughts for more than 48 hours prior to discharge.  Patient denies having homicidal thoughts.  Patient denies having auditory hallucinations.  Patient denies any visual hallucinations or other symptoms of psychosis. The patient was motivated to continue taking medication with a goal of continued improvement in mental health.   The patient reports their target psychiatric symptoms of psychosis and depression responded well to the psychiatric medications, and the patient reports overall benefit other psychiatric hospitalization. Supportive psychotherapy was provided to the patient. The patient also participated in regular group therapy while hospitalized. Coping skills, problem solving as well as relaxation therapies were also part of the unit programming.  Labs were reviewed with the patient, and abnormal results were discussed with the patient.  The patient is able to verbalize their individual safety plan to this provider.  # It is recommended to the patient to continue psychiatric medications as prescribed, after discharge from the hospital.    # It is recommended to the patient to follow up with your outpatient psychiatric provider and PCP.  # It was discussed with the patient, the impact of alcohol, drugs, tobacco have been there overall psychiatric and medical wellbeing, and total abstinence from substance use was recommended the patient.ed.  # Prescriptions provided or sent directly to preferred pharmacy at discharge. Patient agreeable to plan. Given opportunity to ask questions. Appears to feel  comfortable with discharge.    # In the event of  worsening symptoms, the patient is instructed to call the crisis hotline, 911 and or go to the nearest ED for appropriate evaluation and treatment of symptoms. To follow-up with primary care provider for other medical issues, concerns and or health care needs  # Patient was discharged to a local shelter with a plan to follow up as noted below. He is planning to call [facility] continually to secure a bed in residential rehab.     Total Time spent with patient: 20 minutes  Musculoskeletal: Strength & Muscle Tone: within normal limits Gait & Station: normal Patient leans: N/A  Psychiatric Specialty Exam  Presentation  General Appearance: Appropriate for Environment  Eye Contact:Good  Speech:Normal Rate  Speech Volume:Normal  Handedness:Right   Mood and Affect  Mood:Euthymic  Duration of Depression Symptoms: Greater than two weeks  Affect: constricted  Thought Process  Thought Processes:Linear  Descriptions of Associations:Intact  Orientation:Full (Time, Place and Person)  Thought Content:Logical  History of Schizophrenia/Schizoaffective disorder: yes Duration of Psychotic Symptoms: greater than six months Hallucinations:Hallucinations: None  Ideas of Reference:None  Suicidal Thoughts:Suicidal Thoughts: No  Homicidal Thoughts:Homicidal Thoughts: No   Sensorium  Memory:Immediate Fair; Recent Fair; Remote Fair  Judgment:Poor  Insight:Lacking   Executive Functions  Concentration:Poor  Attention Span:Poor  Recall:Fair  Fund of Knowledge:Poor  Language:Poor   Psychomotor Activity  Psychomotor Activity:Psychomotor Activity: Normal   Assets  Assets:Social Support   Sleep  Sleep:Sleep: Fair   Physical Exam: Physical Exam Constitutional:      Appearance: the patient is not toxic-appearing.  Pulmonary:     Effort: Pulmonary effort is normal.  Neurological:     General: No focal deficit  present.     Mental Status: the patient is alert and oriented to person, place, and time.   Review of Systems  Respiratory:  Negative for shortness of breath.   Cardiovascular:  Negative for chest pain.  Gastrointestinal:  Negative for abdominal pain, constipation, diarrhea, nausea and vomiting.  Neurological:  Negative for headaches.   Blood pressure 138/90, pulse (!) 58, temperature 97.9 F (36.6 C), temperature source Oral, resp. rate 14, height 5' 6.5" (1.689 m), weight 121.3 kg, SpO2 100 %. Body mass index is 42.51 kg/m.  Mental Status Per Nursing Assessment::   On Admission:  Suicidal ideation indicated by patient, Suicide plan, Self-harm thoughts  Demographic Factors:  Male  Loss Factors: NA  Historical Factors: Impulsivity  Risk Reduction Factors:   Positive social support  Continued Clinical Symptoms:  Depression:   Severe Substance use problem  Cognitive Features That Contribute To Risk:  None    Suicide Risk:  Mild: patient initially presented with suicidal thoughts. Over the past several days has demonstrated good behavioral control and emotional stability. His medications have been effective in managing his symptoms.    Follow-up Information     Services, [facility]. Call.   Why: If you would like to attend inpatient substance use treatment, please reach out to this clinic and completed intake assessment. Contact information: [address] [place] [address] [phone]         [facility]. Go on 03/18/2022.   Specialty: Behavioral Health Why: Please present for new patient walk in clinic Mon - Fri at 0745 am. Clinic operates on a first come first serve basis and starts seeing patients at 0800. Contact information: [address] [place] [address] [phone]                Plan Of  Care/Follow-up recommendations:   Activity: as tolerated  Diet: heart healthy  Other: -Follow-up  with your outpatient psychiatric provider -instructions on appointment date, time, and address (location) are provided to you in discharge paperwork.  -Take your psychiatric medications as prescribed at discharge - instructions are provided to you in the discharge paperwork  -Follow-up with outpatient primary care doctor and other specialists -for management of chronic medical disease.  -Testing: Follow-up with outpatient provider for abnormal lab results.   -Recommend abstinence from alcohol, tobacco, and other illicit drug use at discharge.   -If your psychiatric symptoms recur, worsen, or if you have side effects to your psychiatric medications, call your outpatient psychiatric provider, 911, [phone] or go to the nearest emergency department.  -If suicidal thoughts recur, call your outpatient psychiatric provider, 911, [phone] or go to the nearest emergency department.   Carlyn Reichert, MD 03/13/2022, 12:31 PM

## 2022-03-13 NOTE — Group Note (Signed)
Recreation Therapy Group Note   Group Topic:Problem Solving  Group Date: 03/13/2022 Start Time: 0930 End Time: 1000 Facilitators: Caroll Rancher, LRT,CTRS Location: [address] Day[address]   Goal Area(s) Addresses:  Patient will effectively work with peer towards shared goal.  Patient will identify skills used to make activity successful.  Patient will identify how skills used during activity can be used to reach post d/c goals.    Group Description:  Straw Bridge. In teams of 3-5, patients were given 15 plastic drinking straws and an equal length of masking tape. Using the materials provided, patients were instructed to build a free standing bridge-like structure to suspend an everyday item (ex: puzzle box) off of the floor or table surface. All materials were required to be used by the team in their design. LRT facilitated post-activity discussion reviewing team process. Patients were encouraged to reflect how the skills used in this activity can be generalized to daily life post discharge.   Affect/Mood: N/A   Participation Level: Did not attend    Clinical Observations/Individualized Feedback:     Plan: Continue to engage patient in RT group sessions 2-3x/week.   Caroll Rancher, LRT,CTRS 03/13/2022 11:41 AM

## 2022-03-13 NOTE — Group Note (Signed)
Date:  03/13/2022 Time:  11:41 AM  Group Topic/Focus:  Dimensions of Wellness:   The focus of this group is to introduce the topic of wellness and discuss the role each dimension of wellness plays in total health.    Participation Level:  Active  Participation Quality:  Appropriate  Affect:  Appropriate  Cognitive:  Appropriate  Insight: Good  Engagement in Group:  Engaged  Modes of Intervention:  Education  Additional Comments:   Educated patient on the reasons for self-actualization. Patient was able to explain  basic needs and how those needs are first in line to personal Development.     Reymundo Poll 03/13/2022, 11:41 AM

## 2022-09-23 ENCOUNTER — Ambulatory Visit (HOSPITAL_COMMUNITY)
Admission: EM | Admit: 2022-09-23 | Discharge: 2022-09-24 | Disposition: A | Discharge status: Other Acute Inpatient Hospital | Payer: Medicare HMO | Attending: Nurse Practitioner | Admitting: Nurse Practitioner

## 2022-09-23 DIAGNOSIS — Z1152 Encounter for screening for COVID-19: Secondary | ICD-10-CM | POA: Diagnosis not present

## 2022-09-23 DIAGNOSIS — F1420 Cocaine dependence, uncomplicated: Secondary | ICD-10-CM | POA: Insufficient documentation

## 2022-09-23 DIAGNOSIS — F840 Autistic disorder: Secondary | ICD-10-CM | POA: Diagnosis not present

## 2022-09-23 DIAGNOSIS — F4310 Post-traumatic stress disorder, unspecified: Secondary | ICD-10-CM | POA: Insufficient documentation

## 2022-09-23 DIAGNOSIS — F329 Major depressive disorder, single episode, unspecified: Secondary | ICD-10-CM | POA: Diagnosis not present

## 2022-09-23 DIAGNOSIS — J45909 Unspecified asthma, uncomplicated: Secondary | ICD-10-CM | POA: Insufficient documentation

## 2022-09-23 DIAGNOSIS — F259 Schizoaffective disorder, unspecified: Secondary | ICD-10-CM | POA: Insufficient documentation

## 2022-09-23 LAB — CBC WITH DIFFERENTIAL/PLATELET
Abs Immature Granulocytes: 0.06 10*3/uL (ref 0.00–0.07)
Basophils Absolute: 0.2 10*3/uL — ABNORMAL HIGH (ref 0.0–0.1)
Basophils Relative: 1 %
Eosinophils Absolute: 0.1 10*3/uL (ref 0.0–0.5)
Eosinophils Relative: 1 %
HCT: 41.4 % (ref 39.0–52.0)
Hemoglobin: 14.8 g/dL (ref 13.0–17.0)
Immature Granulocytes: 1 %
Lymphocytes Relative: 20 %
Lymphs Abs: 2.4 10*3/uL (ref 0.7–4.0)
MCH: 31.4 pg (ref 26.0–34.0)
MCHC: 35.7 g/dL (ref 30.0–36.0)
MCV: 87.7 fL (ref 80.0–100.0)
Monocytes Absolute: 0.5 10*3/uL (ref 0.1–1.0)
Monocytes Relative: 4 %
Neutro Abs: 8.8 10*3/uL — ABNORMAL HIGH (ref 1.7–7.7)
Neutrophils Relative %: 73 %
Platelets: 322 10*3/uL (ref 150–400)
RBC: 4.72 MIL/uL (ref 4.22–5.81)
RDW: 12.3 % (ref 11.5–15.5)
WBC: 12 10*3/uL — ABNORMAL HIGH (ref 4.0–10.5)
nRBC: 0 % (ref 0.0–0.2)

## 2022-09-23 LAB — POCT URINE DRUG SCREEN - MANUAL ENTRY (I-SCREEN)
POC Amphetamine UR: NOT DETECTED
POC Buprenorphine (BUP): NOT DETECTED
POC Cocaine UR: NOT DETECTED
POC Marijuana UR: NOT DETECTED
POC Methadone UR: NOT DETECTED
POC Methamphetamine UR: NOT DETECTED
POC Morphine: NOT DETECTED
POC Oxazepam (BZO): NOT DETECTED
POC Oxycodone UR: NOT DETECTED
POC Secobarbital (BAR): NOT DETECTED

## 2022-09-23 LAB — LIPID PANEL
Cholesterol: 242 mg/dL — ABNORMAL HIGH (ref 0–200)
HDL: 31 mg/dL — ABNORMAL LOW (ref >40)
LDL Cholesterol: 172 mg/dL — ABNORMAL HIGH (ref 0–99)
Total CHOL/HDL Ratio: 7.8 RATIO
Triglycerides: 194 mg/dL — ABNORMAL HIGH (ref <150)
VLDL: 39 mg/dL (ref 0–40)

## 2022-09-23 LAB — COMPREHENSIVE METABOLIC PANEL
ALT: 99 U/L — ABNORMAL HIGH (ref 0–44)
AST: 51 U/L — ABNORMAL HIGH (ref 15–41)
Albumin: 3.9 g/dL (ref 3.5–5.0)
Alkaline Phosphatase: 79 U/L (ref 38–126)
Anion gap: 10 (ref 5–15)
BUN: 8 mg/dL (ref 6–20)
CO2: 23 mmol/L (ref 22–32)
Calcium: 9.3 mg/dL (ref 8.9–10.3)
Chloride: 105 mmol/L (ref 98–111)
Creatinine, Ser: 0.79 mg/dL (ref 0.61–1.24)
GFR, Estimated: >60 mL/min (ref >60)
Glucose, Bld: 100 mg/dL — ABNORMAL HIGH (ref 70–99)
Potassium: 4.1 mmol/L (ref 3.5–5.1)
Sodium: 138 mmol/L (ref 135–145)
Total Bilirubin: 0.5 mg/dL (ref 0.3–1.2)
Total Protein: 7.3 g/dL (ref 6.5–8.1)

## 2022-09-23 LAB — RESP PANEL BY RT-PCR (RSV, FLU A&B, COVID)  RVPGX2
Influenza A by PCR: NEGATIVE
Influenza B by PCR: NEGATIVE
Resp Syncytial Virus by PCR: NEGATIVE
SARS Coronavirus 2 by RT PCR: NEGATIVE

## 2022-09-23 LAB — POC SARS CORONAVIRUS 2 AG: SARSCOV2ONAVIRUS 2 AG: NEGATIVE

## 2022-09-23 LAB — ETHANOL: Alcohol, Ethyl (B): <10 mg/dL (ref <10)

## 2022-09-23 MED ORDER — MAGNESIUM HYDROXIDE 400 MG/5ML PO SUSP: 30.0000 mL | Freq: Every day | ORAL | Status: DC | PRN

## 2022-09-23 MED ORDER — ALUM & MAG HYDROXIDE-SIMETH 200-200-20 MG/5ML PO SUSP: 30.0000 mL | ORAL | Status: DC | PRN

## 2022-09-23 MED ORDER — TRAZODONE HCL 50 MG PO TABS
50.0000 mg | ORAL_TABLET | Freq: Every evening | ORAL | Status: DC | PRN
  Administered 2022-09-23 (×2): 50 mg via ORAL
  Filled 2022-09-23 (×2): qty 1

## 2022-09-23 MED ORDER — OLANZAPINE 5 MG PO TABS
5.0000 mg | ORAL_TABLET | ORAL | Status: DC
  Administered 2022-09-23: 5 mg via ORAL
  Filled 2022-09-23: qty 1

## 2022-09-23 MED ORDER — METFORMIN HCL ER 500 MG PO TB24
500.0000 mg | ORAL_TABLET | Freq: Every day | ORAL | Status: DC
  Administered 2022-09-23 – 2022-09-24 (×2): 500 mg via ORAL
  Filled 2022-09-23 (×2): qty 1

## 2022-09-23 MED ORDER — ALBUTEROL SULFATE HFA 108 (90 BASE) MCG/ACT IN AERS: 2.0000 | INHALATION_SPRAY | RESPIRATORY_TRACT | Status: DC | PRN

## 2022-09-23 MED ORDER — ACETAMINOPHEN 325 MG PO TABS: 650.0000 mg | ORAL_TABLET | Freq: Four times a day (QID) | ORAL | Status: DC | PRN

## 2022-09-23 NOTE — ED Notes (Signed)
Patient resting quietly in bed with eyes closed. Respirations equal and unlabored, skin warm and dry, NAD. Routine safety checks conducted according to facility protocol. Will continue to monitor for safety.  

## 2022-09-23 NOTE — ED Notes (Signed)
Pt sleeping@this time. Breathing even and unlabored. Will continue to monitor for safety 
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
## 2022-09-23 NOTE — ED Notes (Signed)
Patient A&Ox4. Patient denies SI/HI and AVH. Patient denies any physical complaints when asked. No acute distress noted. Support and encouragement provided. Routine safety checks conducted according to facility protocol. Encouraged patient to notify staff if thoughts of harm toward self or others arise. Patient verbalize understanding and agreement. Will continue to monitor for safety.    

## 2022-09-23 NOTE — ED Notes (Signed)
Patient observed/assessed in bed/chair resting quietly appearing in no distress and verbalizing no complaints at this time. Will continue to monitor.  
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
## 2022-09-23 NOTE — ED Notes (Signed)
Patient moved out of flex due to safety concerns. Patient explained why and voices no complaints or concerns. Encouragement and support provided and safety maintain.
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
## 2022-09-23 NOTE — ED Notes (Signed)
Patient eating lunch. Respirations equal and unlabored, skin warm and dry, NAD. Routine safety checks conducted according to facility protocol. Will continue to monitor for safety.  

## 2022-09-24 ENCOUNTER — Encounter (HOSPITAL_COMMUNITY): Payer: Self-pay | Admitting: Registered Nurse

## 2022-09-24 ENCOUNTER — Inpatient Hospital Stay (HOSPITAL_COMMUNITY)
Admission: AD | Admit: 2022-09-24 | Discharge: 2022-09-30 | DRG: 885 | Disposition: A | Discharge status: Home / Self Care | Payer: Medicare HMO | Source: Intra-hospital | Attending: Psychiatry | Admitting: Psychiatry

## 2022-09-24 ENCOUNTER — Other Ambulatory Visit: Payer: Self-pay

## 2022-09-24 DIAGNOSIS — R45850 Homicidal ideations: Secondary | ICD-10-CM

## 2022-09-24 DIAGNOSIS — Z23 Encounter for immunization: Secondary | ICD-10-CM | POA: Diagnosis present

## 2022-09-24 DIAGNOSIS — F250 Schizoaffective disorder, bipolar type: Secondary | ICD-10-CM | POA: Diagnosis not present

## 2022-09-24 DIAGNOSIS — F840 Autistic disorder: Secondary | ICD-10-CM | POA: Diagnosis present

## 2022-09-24 DIAGNOSIS — G4700 Insomnia, unspecified: Secondary | ICD-10-CM | POA: Diagnosis present

## 2022-09-24 DIAGNOSIS — K30 Functional dyspepsia: Secondary | ICD-10-CM | POA: Diagnosis present

## 2022-09-24 DIAGNOSIS — Z79899 Other long term (current) drug therapy: Secondary | ICD-10-CM | POA: Diagnosis not present

## 2022-09-24 DIAGNOSIS — F259 Schizoaffective disorder, unspecified: Secondary | ICD-10-CM | POA: Diagnosis present

## 2022-09-24 DIAGNOSIS — K5900 Constipation, unspecified: Secondary | ICD-10-CM | POA: Diagnosis present

## 2022-09-24 DIAGNOSIS — Z87891 Personal history of nicotine dependence: Secondary | ICD-10-CM

## 2022-09-24 DIAGNOSIS — J45909 Unspecified asthma, uncomplicated: Secondary | ICD-10-CM | POA: Diagnosis present

## 2022-09-24 DIAGNOSIS — E119 Type 2 diabetes mellitus without complications: Secondary | ICD-10-CM | POA: Diagnosis present

## 2022-09-24 DIAGNOSIS — Z20822 Contact with and (suspected) exposure to covid-19: Secondary | ICD-10-CM | POA: Diagnosis present

## 2022-09-24 DIAGNOSIS — Z7984 Long term (current) use of oral hypoglycemic drugs: Secondary | ICD-10-CM | POA: Diagnosis not present

## 2022-09-24 DIAGNOSIS — F4310 Post-traumatic stress disorder, unspecified: Secondary | ICD-10-CM | POA: Diagnosis present

## 2022-09-24 MED ORDER — TRAZODONE HCL 50 MG PO TABS
50.0000 mg | ORAL_TABLET | ORAL | Status: AC
  Administered 2022-09-24: 50 mg via ORAL
  Filled 2022-09-24: qty 1

## 2022-09-24 MED ORDER — ALBUTEROL SULFATE HFA 108 (90 BASE) MCG/ACT IN AERS: 2.0000 | INHALATION_SPRAY | RESPIRATORY_TRACT | Status: DC | PRN

## 2022-09-24 MED ORDER — ACETAMINOPHEN 325 MG PO TABS
650.0000 mg | ORAL_TABLET | Freq: Four times a day (QID) | ORAL | Status: DC | PRN
  Administered 2022-09-24 – 2022-09-27 (×2): 650 mg via ORAL
  Filled 2022-09-24 (×2): qty 2

## 2022-09-24 MED ORDER — MAGNESIUM HYDROXIDE 400 MG/5ML PO SUSP: 30.0000 mL | Freq: Every day | ORAL | Status: DC | PRN

## 2022-09-24 MED ORDER — METFORMIN HCL ER 500 MG PO TB24
500.0000 mg | ORAL_TABLET | Freq: Every day | ORAL | Status: DC
  Administered 2022-09-25 – 2022-09-30 (×6): 500 mg via ORAL
  Filled 2022-09-24 (×8): qty 1

## 2022-09-24 MED ORDER — ESCITALOPRAM OXALATE 10 MG PO TABS
10.0000 mg | ORAL_TABLET | Freq: Every day | ORAL | Status: DC
  Administered 2022-09-25 – 2022-09-30 (×6): 10 mg via ORAL
  Filled 2022-09-24 (×8): qty 1

## 2022-09-24 MED ORDER — ALUM & MAG HYDROXIDE-SIMETH 200-200-20 MG/5ML PO SUSP: 30.0000 mL | ORAL | Status: DC | PRN

## 2022-09-24 MED ORDER — HYDROXYZINE HCL 50 MG PO TABS
50.0000 mg | ORAL_TABLET | ORAL | Status: AC
  Filled 2022-09-24: qty 1

## 2022-09-24 MED ORDER — HYDROXYZINE HCL 25 MG PO TABS
50.0000 mg | ORAL_TABLET | ORAL | Status: AC
  Administered 2022-09-24: 50 mg via ORAL
  Filled 2022-09-24: qty 2

## 2022-09-24 MED ORDER — NICOTINE POLACRILEX 2 MG MT GUM
2.0000 mg | CHEWING_GUM | OROMUCOSAL | Status: DC | PRN
  Administered 2022-09-24 – 2022-09-30 (×25): 2 mg via ORAL
  Filled 2022-09-24 (×18): qty 1

## 2022-09-24 MED ORDER — TRAZODONE HCL 50 MG PO TABS
50.0000 mg | ORAL_TABLET | Freq: Every evening | ORAL | Status: DC | PRN
  Administered 2022-09-24: 50 mg via ORAL
  Filled 2022-09-24: qty 1

## 2022-09-24 MED ORDER — OLANZAPINE 5 MG PO TABS: 5.0000 mg | ORAL_TABLET | ORAL | Status: DC

## 2022-09-24 MED ORDER — INFLUENZA VAC SPLIT QUAD 0.5 ML IM SUSY
0.5000 mL | PREFILLED_SYRINGE | INTRAMUSCULAR | Status: AC
  Administered 2022-09-25: 0.5 mL via INTRAMUSCULAR
  Filled 2022-09-24: qty 0.5

## 2022-09-24 NOTE — ED Notes (Signed)
Voluntary Consent form faxed to [phone] and received confirmation. Original placed in envelope for transfer. Copy and confirmation placed in patients folder. Will continue to monitor.

## 2022-09-24 NOTE — ED Notes (Signed)
Safe Transport called for transportation services to be provided to [facility].

## 2022-09-24 NOTE — ED Notes (Signed)
Report called to Guadalupe Maple RN at [facility].
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
## 2022-09-24 NOTE — Progress Notes (Signed)
Pt was accepted to [facility] 09/24/2022. Bed assignment: 301-2  Pt meets inpatient criteria per Earleen Newport, NP  Attending Physician will be Janine Limbo, MD  Report can be called to: Adult unit: [phone]  Pt can arrive after 12 PM  Care Team Notified: [facility] Scharlene Gloss, RN, Lynnda Shields, RN, Earleen Newport, NP, and Leota Jacobsen, LPN  Denna Haggard, [place]  09/24/2022 10:46 AM

## 2022-09-24 NOTE — ED Notes (Signed)
Pt sleeping@this time. Breathing even and unlabored. Will continue to monitor for safety 

## 2022-09-24 NOTE — Progress Notes (Signed)
The patient rated his day as a 4 or 5 out of 10. He states that his peers have been nice to him and that he spoke with his sister.
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
## 2022-09-24 NOTE — Tx Team (Signed)
Initial Treatment Plan 09/24/2022 2:35 PM John Gibson PFX:[id number]    PATIENT STRESSORS: Marital or family conflict   Medication change or noncompliance     PATIENT STRENGTHS: Ability for insight  Communication skills  General fund of knowledge  Motivation for treatment/growth  Supportive family/friends    PATIENT IDENTIFIED PROBLEMS: Suicide Risk  Anger management "Need help with my anger."  Coping skills for depression                 DISCHARGE CRITERIA:  Improved stabilization in mood, thinking, and/or behavior Need for constant or close observation no longer present Reduction of life-threatening or endangering symptoms to within safe limits  PRELIMINARY DISCHARGE PLAN: Return to previous living arrangement  PATIENT/FAMILY INVOLVEMENT: This treatment plan has been presented to and reviewed with the patient, John Gibson.  The patient and family have been given the opportunity to ask questions and make suggestions.  Maudie Flakes, RN 09/24/2022, 2:35 PM

## 2022-09-24 NOTE — Plan of Care (Signed)
  Problem: Education: Goal: Knowledge of [facility] Education information/materials will improve Outcome: Progressing Goal: Emotional status will improve Outcome: Progressing Goal: Verbalization of understanding the information provided will improve Outcome: Progressing

## 2022-09-25 ENCOUNTER — Encounter (HOSPITAL_COMMUNITY): Payer: Self-pay

## 2022-09-25 MED ORDER — OLANZAPINE 5 MG PO TBDP
5.0000 mg | ORAL_TABLET | Freq: Three times a day (TID) | ORAL | Status: DC | PRN
  Administered 2022-09-25 – 2022-09-28 (×2): 5 mg via ORAL
  Filled 2022-09-25 (×2): qty 1

## 2022-09-25 MED ORDER — ZIPRASIDONE MESYLATE 20 MG IM SOLR: 20.0000 mg | INTRAMUSCULAR | Status: DC | PRN

## 2022-09-25 MED ORDER — LORAZEPAM 1 MG PO TABS
1.0000 mg | ORAL_TABLET | ORAL | Status: AC | PRN
  Administered 2022-09-27: 1 mg via ORAL
  Filled 2022-09-25: qty 1

## 2022-09-25 MED ORDER — TRAZODONE HCL 100 MG PO TABS
100.0000 mg | ORAL_TABLET | Freq: Every evening | ORAL | Status: DC | PRN
  Administered 2022-09-25 – 2022-09-29 (×5): 100 mg via ORAL
  Filled 2022-09-25 (×5): qty 1

## 2022-09-25 NOTE — Progress Notes (Signed)
   09/25/22 0800  Psych Admission Type (Psych Patients Only)  Admission Status Voluntary  Psychosocial Assessment  Patient Complaints Anxiety  Eye Contact Intense  Facial Expression Anxious;Animated  Affect Anxious  Speech Logical/coherent  Interaction Attention-seeking;Dominating  Motor Activity Fidgety  Appearance/Hygiene Improved  Behavior Characteristics Anxious  Mood Anxious  Thought Process  Coherency WDL  Content WDL  Delusions None reported or observed  Perception WDL  Hallucination None reported or observed  Judgment Poor  Confusion None  Danger to Self  Current suicidal ideation? Denies  Agreement Not to Harm Self Yes  Description of Agreement Verbal  Danger to Others  Danger to Others None reported or observed  Danger to Others Abnormal  Harmful Behavior to others No threats or harm toward other people  Destructive Behavior No threats or harm toward property
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
## 2022-09-25 NOTE — Progress Notes (Signed)
The patient attended the evening N.A.meeting.  
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
## 2022-09-25 NOTE — Plan of Care (Signed)
  Problem: Education: Goal: Knowledge of [facility] Education information/materials will improve Outcome: Progressing Goal: Emotional status will improve Outcome: Progressing Goal: Mental status will improve Outcome: Progressing Goal: Verbalization of understanding the information provided will improve Outcome: Progressing   Problem: Activity: Goal: Interest or engagement in activities will improve Outcome: Progressing Goal: Sleeping patterns will improve Outcome: Progressing   

## 2022-09-25 NOTE — Progress Notes (Signed)
   09/24/22 2300  Psych Admission Type (Psych Patients Only)  Admission Status Voluntary  Psychosocial Assessment  Patient Complaints Anxiety  Eye Contact Glaring  Facial Expression Flat  Affect Anxious  Speech Logical/coherent  Interaction Attention-seeking  Motor Activity Pacing;Fidgety  Appearance/Hygiene Improved  Behavior Characteristics Anxious  Mood Anxious  Thought Process  Coherency WDL  Content WDL  Delusions None reported or observed  Perception WDL  Hallucination None reported or observed  Judgment Poor  Confusion None  Danger to Self  Current suicidal ideation? Denies  Agreement Not to Harm Self Yes  Description of Agreement Verbal  Danger to Others  Danger to Others None reported or observed  Danger to Others Abnormal  Harmful Behavior to others No threats or harm toward other people  Destructive Behavior No threats or harm toward property

## 2022-09-25 NOTE — [facility] Counselor (Signed)
CSW provided the Pt with a packet that contains information including shelter and housing resources, free and reduced price food information, clothing resources, crisis center information, a [employer] card, and suicide prevention information.   

## 2022-09-25 NOTE — Group Note (Signed)
Recreation Therapy Group Note   Group Topic:Problem Solving  Group Date: 09/25/2022 Start Time: 0935 End Time: 0952 Facilitators: Daviona Herbert-McCall, LRT,CTRS Location: [address] Dayroom   Goal Area(s) Addresses:  Patient will effectively work with peer towards shared goal.  Patient will identify skills used to make activity successful.  Patient will share challenges and verbalize solution-driven approaches used. Patient will identify how skills used during activity can be used to reach post d/c goals.   Group Description: [employer]. Patients were provided the following materials: 5 drinking straws, 5 rubber bands, 5 paper clips, 2 index cards, and 2 drinking cups. Using the provided materials patients were asked to build a launching mechanism to launch a ping pong ball across the room, approximately 10 feet. Patients were divided into teams of 3-5. Instructions required all materials be incorporated into the device, functionality of items left to the peer group's discretion.   Affect/Mood: N/A   Participation Level: Did not attend    Clinical Observations/Individualized Feedback:     Plan: Continue to engage patient in RT group sessions 2-3x/week.   Brandie Lopes-McCall, LRT,CTRS 09/25/2022 1:44 PM

## 2022-09-25 NOTE — BH IP Treatment Plan (Signed)
Interdisciplinary Treatment and Diagnostic Plan Update  09/25/2022 Time of Session: 9:45am  Scottie Metayer MRN: [id number]  Principal Diagnosis: Schizoaffective disorder [facility])  Secondary Diagnoses: Principal Problem:   Schizoaffective disorder ([place]) Active Problems:   Homicidal thoughts   Current Medications:  Current Facility-Administered Medications  Medication Dose Route Frequency Provider Last Rate Last Admin   acetaminophen (TYLENOL) tablet 650 mg  650 mg Oral Q6H PRN Rankin, Shuvon B, NP   650 mg at 09/24/22 2122   albuterol (VENTOLIN HFA) 108 (90 Base) MCG/ACT inhaler 2 puff  2 puff Inhalation Q4H PRN Rankin, Shuvon B, NP       alum & mag hydroxide-simeth (MAALOX/MYLANTA) 200-200-20 MG/5ML suspension 30 mL  30 mL Oral Q4H PRN Rankin, Shuvon B, NP       escitalopram (LEXAPRO) tablet 10 mg  10 mg Oral Daily Ranae Palms, MD   10 mg at 09/25/22 0815   magnesium hydroxide (MILK OF MAGNESIA) suspension 30 mL  30 mL Oral Daily PRN Rankin, Shuvon B, NP       metFORMIN (GLUCOPHAGE-XR) 24 hr tablet 500 mg  500 mg Oral Q breakfast Rankin, Shuvon B, NP   500 mg at 09/25/22 0815   nicotine polacrilex (NICORETTE) gum 2 mg  2 mg Oral PRN Massengill, Ovid Curd, MD   2 mg at 09/25/22 1258   OLANZapine (ZYPREXA) tablet 5 mg  5 mg Oral UD Rankin, Shuvon B, NP       traZODone (DESYREL) tablet 50 mg  50 mg Oral QHS PRN Rankin, Shuvon B, NP   50 mg at 09/24/22 2121   PTA Medications: Medications Prior to Admission  Medication Sig Dispense Refill Last Dose   albuterol (VENTOLIN HFA) 108 (90 Base) MCG/ACT inhaler Inhale 2 puffs into the lungs every 4 (four) hours as needed for wheezing or shortness of breath.      hydrOXYzine (ATARAX) 50 MG tablet Take 50 mg by mouth 3 (three) times daily.      metFORMIN (GLUCOPHAGE) 500 MG tablet Take 500 mg by mouth daily.      OLANZapine (ZYPREXA) 10 MG tablet Take 10 mg by mouth at bedtime.      OLANZapine (ZYPREXA) 5 MG tablet Take 1 tablet (5 mg total) by mouth  as directed. Take 1 tablet at 8am. take 1 tablet at 2pm. take 2 tablets at bedtime. (Patient taking differently: Take 5 mg by mouth daily.) 120 tablet 0    sertraline (ZOLOFT) 100 MG tablet Take 100 mg by mouth daily.      traZODone (DESYREL) 50 MG tablet Take 1 tablet (50 mg total) by mouth at bedtime as needed for sleep. (Patient taking differently: Take 50 mg by mouth at bedtime.) 30 tablet 0     Patient Stressors: Marital or family conflict   Medication change or noncompliance    Patient Strengths: Ability for insight  Marketing executive fund of knowledge  Motivation for treatment/growth  Supportive family/friends   Treatment Modalities: Medication Management, Group therapy, Case management,  1 to 1 session with clinician, Psychoeducation, Recreational therapy.   Physician Treatment Plan for Primary Diagnosis: Schizoaffective disorder [facility]) Long Term Goal(s): Improvement in symptoms so as ready for discharge improved coping skills and no aggressive behavior towards family.   Short Term Goals: Ability to identify changes in lifestyle to reduce recurrence of condition will improve Ability to verbalize feelings will improve Ability to disclose and discuss suicidal ideas Ability to demonstrate self-control will improve  Medication Management: Evaluate patient's response, side  effects, and tolerance of medication regimen.  Therapeutic Interventions: 1 to 1 sessions, Unit Group sessions and Medication administration.  Evaluation of Outcomes: Not Met  Physician Treatment Plan for Secondary Diagnosis: Principal Problem:   Schizoaffective disorder (HCC) Active Problems:   Homicidal thoughts  Long Term Goal(s): Improvement in symptoms so as ready for discharge improved coping skills and no aggressive behavior towards family.   Short Term Goals: Ability to identify changes in lifestyle to reduce recurrence of condition will improve Ability to verbalize feelings will  improve Ability to disclose and discuss suicidal ideas Ability to demonstrate self-control will improve     Medication Management: Evaluate patient's response, side effects, and tolerance of medication regimen.  Therapeutic Interventions: 1 to 1 sessions, Unit Group sessions and Medication administration.  Evaluation of Outcomes: Not Met   RN Treatment Plan for Primary Diagnosis: Schizoaffective disorder (HCC) Long Term Goal(s): Knowledge of disease and therapeutic regimen to maintain health will improve  Short Term Goals: Ability to remain free from injury will improve, Ability to participate in decision making will improve, Ability to verbalize feelings will improve, Ability to disclose and discuss suicidal ideas, and Ability to identify and develop effective coping behaviors will improve  Medication Management: RN will administer medications as ordered by provider, will assess and evaluate patient's response and provide education to patient for prescribed medication. RN will report any adverse and/or side effects to prescribing provider.  Therapeutic Interventions: 1 on 1 counseling sessions, Psychoeducation, Medication administration, Evaluate responses to treatment, Monitor vital signs and CBGs as ordered, Perform/monitor CIWA, COWS, AIMS and Fall Risk screenings as ordered, Perform wound care treatments as ordered.  Evaluation of Outcomes: Not Met   LCSW Treatment Plan for Primary Diagnosis: Schizoaffective disorder (HCC) Long Term Goal(s): Safe transition to appropriate next level of care at discharge, Engage patient in therapeutic group addressing interpersonal concerns.  Short Term Goals: Engage patient in aftercare planning with referrals and resources, Increase social support, Increase emotional regulation, Facilitate acceptance of mental health diagnosis and concerns, Identify triggers associated with mental health/substance abuse issues, and Increase skills for wellness and  recovery  Therapeutic Interventions: Assess for all discharge needs, 1 to 1 time with Social worker, Explore available resources and support systems, Assess for adequacy in community support network, Educate family and significant other(s) on suicide prevention, Complete Psychosocial Assessment, Interpersonal group therapy.  Evaluation of Outcomes: Not Met   Progress in Treatment: Attending groups: Yes. Participating in groups: Yes. Taking medication as prescribed: Yes. Toleration medication: Yes. Family/Significant other contact made: Yes, individual(s) contacted:  Sister  Patient understands diagnosis: Yes. Discussing patient identified problems/goals with staff: Yes. Medical problems stabilized or resolved: Yes. Denies suicidal/homicidal ideation: Yes. Issues/concerns per patient self-inventory: No.   New problem(s) identified: No, Describe:  None reported   New Short Term/Long Term Goal(s): medication stabilization, elimination of SI thoughts, development of comprehensive mental wellness plan.   Patient Goals: "To manage my anger"   Discharge Plan or Barriers: Patient recently admitted. CSW will continue to follow and assess for appropriate referrals and possible discharge planning.   Reason for Continuation of Hospitalization: Anxiety Depression Medication stabilization Suicidal ideation  Estimated Length of Stay: 3 to 7 days   Last 3 [place] Suicide Severity Risk Score: Flowsheet Row [facility]22 in [facility] (Discharged) [facility]  C-SSRS RISK CATEGORY Moderate Risk Moderate Risk High  Risk       Last PHQ 2/9 Scores:    09/23/2022    2:36 PM 09/23/2022    2:17 AM  Depression screen PHQ 2/9  Decreased Interest 0 1  Down, Depressed, Hopeless 0 1  PHQ - 2 Score 0 2  Altered sleeping  0 1  Tired, decreased energy 0 1  Change in appetite 0 0  Feeling bad or failure about yourself  0 1  Trouble concentrating 0 1  Moving slowly or fidgety/restless 0 0  Suicidal thoughts 0   PHQ-9 Score 0 6  Difficult doing work/chores Not difficult at all Somewhat difficult    Scribe for Treatment Team: Darleen Crocker, Latanya Presser 09/25/2022 1:40 PM

## 2022-09-26 MED ORDER — OLANZAPINE 5 MG PO TABS
5.0000 mg | ORAL_TABLET | Freq: Every day | ORAL | Status: DC
  Administered 2022-09-26 – 2022-09-29 (×4): 5 mg via ORAL
  Filled 2022-09-26 (×7): qty 1
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
## 2022-09-26 NOTE — [facility] Group Notes (Signed)
Pt. did attend wrap up group.  Pt. did participate and was cooperative.

## 2022-09-26 NOTE — Progress Notes (Signed)
   09/26/22 1000  Psych Admission Type (Psych Patients Only)  Admission Status Voluntary  Psychosocial Assessment  Patient Complaints Anxiety  Eye Contact Intense  Facial Expression Anxious  Affect Anxious  Speech Logical/coherent  Interaction Attention-seeking  Motor Activity Fidgety  Appearance/Hygiene Improved  Behavior Characteristics Anxious  Mood Anxious  Thought Process  Coherency WDL  Content WDL  Delusions None reported or observed  Perception WDL  Hallucination None reported or observed  Judgment Poor  Confusion None  Danger to Self  Current suicidal ideation? Denies  Agreement Not to Harm Self Yes  Description of Agreement verbal contract  Danger to Others  Danger to Others None reported or observed  Danger to Others Abnormal  Harmful Behavior to others No threats or harm toward other people  Destructive Behavior No threats or harm toward property

## 2022-09-26 NOTE — Group Note (Signed)
LCSW Group Therapy Note   Group Date: 09/26/2022 Start Time: 1100 End Time: 1200  Type of Therapy and Topic:  Group Therapy:  Setting Goals   Participation Level:  Active   Description of Group: In this process group, patients discussed using strengths to work toward goals and address challenges.  Patients identified one goal they are working on. Patients were given the opportunity to share openly and support each other's plan for self-empowerment.  The group discussed the acronym SMART to help individuals develop and work through their established goals. Patients were encouraged to identify a plan to utilize their strengths to work on current challenges and goals.   Therapeutic Goals Patient will verbalize personal strengths/positive qualities and relate how these can assist with achieving desired personal goals. Patients will verbalize affirmation of peers plans for personal change and goal setting. Patients will use the acronym SMART to help develop a plan for their goals. Patients will verbalize a plan for regular reinforcement of personal positive qualities and circumstances.   Summary of Patient Progress: Patient identified the definition of goals. Patient was given the opportunity to share openly and support other group members' plan for self-empowerment. Patient verbalized personal strength and how they relate to achieving the desired goal. Patient was able to identify positive goals to work towards when they return home.      Therapeutic Modalities Cognitive Behavioral Therapy Motivational Interviewing  Darleen Crocker, [place] 09/26/2022  1:24 PM

## 2022-09-27 NOTE — BHH Group Notes (Signed)
Adult Psychoeducational Group Note  Date:  09/27/2022 Time:  9:54 AM  Group Topic/Focus:  Goals Group:   The focus of this group is to help patients establish daily goals to achieve during treatment and discuss how the patient can incorporate goal setting into their daily lives to aide in recovery.  Participation Level:  Active  Participation Quality:  Appropriate  Affect:  Appropriate  Cognitive:  Appropriate  Insight: Appropriate  Engagement in Group:  Engaged  Modes of Intervention:  Discussion  Additional Comments:  Patient goal of the day is to maintain composure.   John Gibson 09/27/2022, 9:54 AM

## 2022-09-27 NOTE — BHH Suicide Risk Assessment (Signed)
John Gibson INPATIENT:  Family/Significant Other Suicide Prevention Education  Suicide Prevention Education:  Contact Attempts: John Gibson [phone] (Sister) has been identified by the patient as the family member/significant other with whom the patient will be residing, and identified as the person(s) who will aid the patient in the event of a mental health crisis.  With written consent from the patient, two attempts were made to provide suicide prevention education, prior to and/or following the patient's discharge.  We were unsuccessful in providing suicide prevention education.  A suicide education pamphlet was given to the patient to share with family/significant other.  Date and time of first attempt: 09/25/2022 at 3:07pm  Date and time of second attempt: 09/26/2022 at 9:54am   CSW left 2 HIPAA approved voicemail's asking for a return call.  CSW has received no return calls or voicemail's.    Frutoso Chase Vallie Fayette 09/27/2022, 11:09 AM

## 2022-09-27 NOTE — Progress Notes (Signed)
   09/27/22 0500  Psych Admission Type (Psych Patients Only)  Admission Status Voluntary  Psychosocial Assessment  Patient Complaints Anxiety  Eye Contact Intense  Facial Expression Anxious  Affect Anxious  Speech Logical/coherent  Interaction Attention-seeking  Motor Activity Fidgety  Appearance/Hygiene Improved  Behavior Characteristics Anxious  Mood Anxious  Thought Process  Coherency WDL  Content WDL  Delusions None reported or observed  Perception WDL  Hallucination None reported or observed  Judgment Poor  Confusion None  Danger to Self  Current suicidal ideation? Denies  Agreement Not to Harm Self Yes  Description of Agreement verbal  Danger to Others  Danger to Others None reported or observed  Danger to Others Abnormal  Harmful Behavior to others No threats or harm toward other people  Destructive Behavior No threats or harm toward property   Alert/oriented. Makes needs/concerns known to staff. Pleasant cooperative with staff. Denies SI/HI/A/V hallucinations. Med compliant. Patient states went to group. Will encourage continue compliance and progression towards goals. Verbally contracted for safety. Will continue to monitor.

## 2022-09-27 NOTE — Plan of Care (Signed)
  Problem: Safety: Goal: Periods of time without injury will increase Outcome: Progressing   

## 2022-09-27 NOTE — Group Note (Incomplete)
Date:  09/27/2022 Time:  4:30 PM  Group Topic/Focus:  Recovery Goals:   The focus of this group is to identify appropriate goals for recovery and establish a plan to achieve them.    Participation Level:  Active  Participation Quality:  Appropriate  Affect:  Appropriate  Cognitive:  Appropriate  Insight: Appropriate  Engagement in Group:  Engaged  Modes of Intervention:  Exploration  Additional Comments:   Patient   John Gibson 09/27/2022, 4:30 PM

## 2022-09-27 NOTE — BHH Counselor (Signed)
CSW spoke with the Pt about discharge planning.  The Pt states that he was able to speak with his sister and she states that he cannot return to her home.  The Pt states that he would like to go to the [facility] and "see what they can offer me".  The Pt requested to be discharged today but states that he is also ok with discharging Monday.  CSW will inform the providers of this information.
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
## 2022-09-27 NOTE — Group Note (Signed)
Recreation Therapy Group Note   Group Topic:Stress Management  Group Date: 09/27/2022 Start Time: 0945 End Time: 1003 Facilitators: Loretta Kluender-McCall, LRT,CTRS Location: [address] Dayroom   Goal Area(s) Addresses:  Patient will actively participate in stress management techniques presented during session.  Patient will successfully identify benefit of practicing stress management post d/c.    Group Description: Guided Imagery. LRT provided education, instruction, and demonstration on practice of visualization via guided imagery. Patient was asked to participate in the technique introduced during session. LRT read a script that focused on visualizing a peaceful place that helps them relax, decompress and get away mentally.   LRT debriefed including topics of mindfulness, stress management and specific scenarios each patient could use these techniques. Patients were given suggestions of ways to access scripts post d/c and encouraged to explore Youtube and other apps available on smartphones, tablets, and computers.   Affect/Mood: Appropriate   Participation Level: Active   Participation Quality: Independent   Behavior: Attentive    Speech/Thought Process: Focused   Insight: Good   Judgement: Good   Modes of Intervention: Script, Nature Sounds   Patient Response to Interventions:  Attentive   Education Outcome:  Acknowledges education and In group clarification offered    Clinical Observations/Individualized Feedback: Pt attended and participated in group session.  Pt expressed his place of relaxation was at the beach.     Plan: Continue to engage patient in RT group sessions 2-3x/week.   Braylea Brancato-McCall, LRT,CTRS 09/27/2022 11:52 AM

## 2022-09-27 NOTE — BHH Group Notes (Signed)
Pt attended AA meeting.  

## 2022-09-27 NOTE — Progress Notes (Signed)
   09/27/22 1339  Psych Admission Type (Psych Patients Only)  Admission Status Voluntary  Psychosocial Assessment  Patient Complaints Anxiety  Eye Contact Intense  Facial Expression Anxious  Affect Anxious  Speech Logical/coherent  Interaction Assertive;Attention-seeking;Demanding  Motor Activity Fidgety  Appearance/Hygiene Improved  Behavior Characteristics Anxious  Mood Anxious  Thought Process  Coherency WDL  Content WDL  Delusions None reported or observed  Perception WDL  Hallucination None reported or observed  Judgment Poor  Confusion None  Danger to Self  Current suicidal ideation? Denies  Agreement Not to Harm Self Yes  Description of Agreement Verbal  Danger to Others  Danger to Others None reported or observed  Danger to Others Abnormal  Harmful Behavior to others No threats or harm toward other people  Destructive Behavior No threats or harm toward property

## 2022-09-27 NOTE — Group Note (Signed)
Date:  09/27/2022 Time:  10:50 AM  Group Topic/Focus:  Orientation:   The focus of this group is to educate the patient on the purpose and policies of crisis stabilization and provide a format to answer questions about their admission.  The group details unit policies and expectations of patients while admitted.    Participation Level:  Active  Participation Quality:  Appropriate  Affect:  Appropriate  Cognitive:  Appropriate  Insight: Appropriate  Engagement in Group:  Engaged  Modes of Intervention:  Discussion  Additional Comments:     Jerrye Beavers 09/27/2022, 10:50 AM

## 2022-09-28 DIAGNOSIS — F259 Schizoaffective disorder, unspecified: Principal | ICD-10-CM

## 2022-09-28 MED ORDER — OLANZAPINE 5 MG PO TBDP: 5.0000 mg | ORAL_TABLET | Freq: Three times a day (TID) | ORAL | Status: DC | PRN

## 2022-09-28 MED ORDER — LORAZEPAM 1 MG PO TABS
1.0000 mg | ORAL_TABLET | ORAL | Status: AC | PRN
  Administered 2022-09-28: 1 mg via ORAL
  Filled 2022-09-28: qty 1

## 2022-09-28 MED ORDER — ZIPRASIDONE MESYLATE 20 MG IM SOLR: 20.0000 mg | INTRAMUSCULAR | Status: DC | PRN

## 2022-09-28 NOTE — Plan of Care (Signed)
  Problem: Safety: Goal: Periods of time without injury will increase Outcome: Progressing   

## 2022-09-28 NOTE — Group Note (Signed)
LCSW Group Therapy Note  Group Date: 09/28/2022 Start Time: 1010 End Time: 1110   Type of Therapy and Topic:  Group Therapy: Anger Cues and Responses  Participation Level:  Did Not Attend   Description of Group:   In this group, patients learned how to recognize the physical, cognitive, emotional, and behavioral responses they have to anger-provoking situations.  They identified a recent time they became angry and how they reacted.  They analyzed how their reaction was possibly beneficial and how it was possibly unhelpful.  The group discussed a variety of healthier coping skills that could help with such a situation in the future.  Focus was placed on how helpful it is to recognize the underlying emotions to our anger, because working on those can lead to a more permanent solution as well as our ability to focus on the important rather than the urgent.  Therapeutic Goals: Patients will remember their last incident of anger and how they felt emotionally and physically, what their thoughts were at the time, and how they behaved. Patients will identify how their behavior at that time worked for them, as well as how it worked against them. Patients will explore possible new behaviors to use in future anger situations. Patients will learn that anger itself is normal and cannot be eliminated, and that healthier reactions can assist with resolving conflict rather than worsening situations.  Summary of Patient Progress:  Patient was invited to group, did not attend.   Therapeutic Modalities:   Cognitive Behavioral Therapy    Elva Mauro J Grossman-Orr, LCSWA 09/28/2022  2:45 PM    
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
## 2022-09-28 NOTE — Progress Notes (Signed)
[place] Group Notes:  (Nursing/MHT/Case Management/Adjunct)  Date:  09/28/2022  Time:  2000 Type of Therapy:   wrap up group  Participation Level:  Active  Participation Quality:  Appropriate, Attentive, Sharing, and Supportive  Affect:  Appropriate  Cognitive:  Lacking  Insight:  Improving  Engagement in Group:  Engaged  Modes of Intervention:  Clarification, Education, and Support  Summary of Progress/Problems: Positive thinking and positive change were discussed.   Shellia Cleverly 09/28/2022, 11:28 PM

## 2022-09-28 NOTE — Progress Notes (Signed)
   09/28/22 2100  Psych Admission Type (Psych Patients Only)  Admission Status Voluntary  Psychosocial Assessment  Patient Complaints Anxiety  Eye Contact Fair  Facial Expression Anxious  Affect Anxious  Speech Logical/coherent  Interaction Assertive  Motor Activity Fidgety  Appearance/Hygiene Unremarkable  Behavior Characteristics Anxious  Mood Anxious  Thought Process  Coherency WDL  Content WDL  Delusions None reported or observed  Perception WDL  Hallucination None reported or observed  Judgment Poor  Confusion None  Danger to Self  Current suicidal ideation? Denies  Agreement Not to Harm Self Yes  Description of Agreement verbal  Danger to Others  Danger to Others None reported or observed  Danger to Others Abnormal  Harmful Behavior to others No threats or harm toward other people  Destructive Behavior No threats or harm toward property   Alert/oriented. Makes needs/concerns known to staff. Cooperative with staff at times. Denies SI/HI/A/V hallucinations. Med compliant. PRN med given with good effect. Patient states went to group. Will encourage continue compliance and progression towards goals. Verbally contracted for safety. Will continue to monitor.

## 2022-09-28 NOTE — Progress Notes (Signed)
   09/28/22 1200  Psych Admission Type (Psych Patients Only)  Admission Status Voluntary  Psychosocial Assessment  Patient Complaints Anxiety  Eye Contact Fair  Facial Expression Anxious  Affect Anxious  Speech Logical/coherent  Interaction Assertive  Motor Activity Fidgety  Appearance/Hygiene Unremarkable  Behavior Characteristics Anxious;Agitated  Mood Anxious  Thought Process  Coherency WDL  Content WDL  Delusions None reported or observed  Perception WDL  Hallucination None reported or observed  Judgment Poor  Danger to Self  Current suicidal ideation? Denies  Agreement Not to Harm Self Yes  Description of Agreement verbal  Danger to Others  Danger to Others None reported or observed  Danger to Others Abnormal  Harmful Behavior to others No threats or harm toward other people  Destructive Behavior No threats or harm toward property

## 2022-09-28 NOTE — Progress Notes (Signed)
   09/28/22 0200  Psych Admission Type (Psych Patients Only)  Admission Status Voluntary  Psychosocial Assessment  Patient Complaints Anxiety  Eye Contact Fair  Facial Expression Anxious  Affect Anxious  Speech Logical/coherent  Interaction Assertive  Motor Activity Fidgety  Appearance/Hygiene Unremarkable  Behavior Characteristics Anxious;Agitated  Mood Irritable  Thought Process  Coherency WDL  Content WDL  Delusions None reported or observed  Perception WDL  Hallucination None reported or observed  Judgment Poor  Confusion None  Danger to Self  Current suicidal ideation? Denies  Agreement Not to Harm Self Yes  Description of Agreement verbal  Danger to Others  Danger to Others None reported or observed  Danger to Others Abnormal  Harmful Behavior to others No threats or harm toward other people  Destructive Behavior No threats or harm toward property   Alert/oriented. Makes needs/concerns known to staff. Patient observed agitated/prn given with good effect. Denies SI/HI/A/V hallucinations. Med compliant. Patient states went to group. Will encourage continue compliance and progression towards goals. Verbally contracted for safety. Will continue to monitor.

## 2022-09-29 NOTE — Progress Notes (Signed)
   09/29/22 2048  Psych Admission Type (Psych Patients Only)  Admission Status Voluntary  Psychosocial Assessment  Patient Complaints None  Eye Contact Fair  Facial Expression Animated  Affect Appropriate to circumstance  Speech Logical/coherent  Interaction Assertive  Motor Activity Fidgety  Appearance/Hygiene Unremarkable  Behavior Characteristics Cooperative;Appropriate to situation  Mood Pleasant  Thought Process  Coherency WDL  Content WDL  Delusions None reported or observed  Perception WDL  Hallucination None reported or observed  Judgment Poor  Confusion None  Danger to Self  Current suicidal ideation? Denies  Self-Injurious Behavior No self-injurious ideation or behavior indicators observed or expressed   Agreement Not to Harm Self Yes  Description of Agreement Verbal  Danger to Others  Danger to Others None reported or observed  Danger to Others Abnormal  Harmful Behavior to others No threats or harm toward other people  Destructive Behavior No threats or harm toward property

## 2022-09-29 NOTE — Progress Notes (Addendum)
D: Pt slept for almost half the shift, woke up to take his am meds then went back to bed, reported that he was too tired to attend group. Pt reported that he slept well last night, but felt that he needed more sleep today. Pt presented brighter this afternoon, observed in the dayroom interacting appropriately with peers.   A. Labs and vitals monitored. Pt given and educated on medications. Pt supported emotionally and encouraged to express concerns and ask questions.   R. Pt remains safe with 15 minute checks. Will continue POC.    09/29/22 1000  Psych Admission Type (Psych Patients Only)  Admission Status Voluntary  Psychosocial Assessment  Patient Complaints None  Eye Contact Fair  Facial Expression Anxious  Affect Appropriate to circumstance  Speech Logical/coherent  Interaction Assertive  Motor Activity Other (Comment) (steady gait)  Appearance/Hygiene Unremarkable  Behavior Characteristics Cooperative;Anxious  Mood Anxious;Depressed  Thought Process  Coherency WDL  Content WDL  Delusions None reported or observed  Perception WDL  Hallucination None reported or observed  Judgment Poor  Confusion None  Danger to Self  Current suicidal ideation? Denies  Agreement Not to Harm Self Yes  Description of Agreement verbal contract for safety  Danger to Others  Danger to Others None reported or observed  Danger to Others Abnormal  Harmful Behavior to others No threats or harm toward other people  Destructive Behavior No threats or harm toward property

## 2022-09-29 NOTE — Plan of Care (Signed)
  Problem: Education: Goal: Emotional status will improve Outcome: Progressing Goal: Mental status will improve Outcome: Progressing   Problem: Activity: Goal: Interest or engagement in activities will improve Outcome: Progressing   Problem: Coping: Goal: Ability to demonstrate self-control will improve Outcome: Progressing   Problem: Health Behavior/Discharge Planning: Goal: Compliance with treatment plan for underlying cause of condition will improve Outcome: Progressing
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
## 2022-09-29 NOTE — [facility] Group Notes (Signed)
Pt did not attend goals group. 
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
## 2022-09-30 MED ORDER — ESCITALOPRAM OXALATE 10 MG PO TABS: 10.0000 mg | ORAL_TABLET | Freq: Every day | ORAL | 0 refills | Status: AC

## 2022-09-30 MED ORDER — TRAZODONE HCL 100 MG PO TABS: 100.0000 mg | ORAL_TABLET | Freq: Every evening | ORAL | 0 refills | Status: AC | PRN

## 2022-09-30 MED ORDER — NICOTINE POLACRILEX 2 MG MT GUM: 2.0000 mg | CHEWING_GUM | OROMUCOSAL | 0 refills | Status: AC | PRN

## 2022-09-30 MED ORDER — METFORMIN HCL ER 500 MG PO TB24: 500.0000 mg | ORAL_TABLET | Freq: Every day | ORAL | 0 refills | Status: AC

## 2022-09-30 NOTE — [facility] Suicide Risk Assessment (Signed)
[facility] Discharge Suicide Risk Assessment   Principal Problem: Schizoaffective disorder [facility]) Discharge Diagnoses: Principal Problem:   Schizoaffective disorder ([place]) Active Problems:   Homicidal thoughts   Total Time spent with patient: 20 minutes  Musculoskeletal: Strength & Muscle Tone: within normal limits Gait & Station: normal Patient leans: N/A  Psychiatric Specialty Exam  Presentation  General Appearance:  Other (comment) (Continued poor grooming)  Eye Contact: Minimal  Speech: Clear and Coherent; Normal Rate  Speech Volume: Normal  Handedness: Right   Mood and Affect  Mood: -- (Improving)  Duration of Depression Symptoms: Greater than two weeks  Affect: Congruent   Thought Process  Thought Processes: Coherent  Descriptions of Associations:Intact  Orientation:Full (Time, Place and Person)  Thought Content:Logical  History of Schizophrenia/Schizoaffective disorder:Yes  Duration of Psychotic Symptoms:Greater than six months  Hallucinations:Hallucinations: None Description of Auditory Hallucinations: Currently denies Description of Visual Hallucinations: Currently denies  Ideas of Reference:None  Suicidal Thoughts:Suicidal Thoughts: No  Homicidal Thoughts:Homicidal Thoughts: No   Sensorium  Memory: Immediate Fair; Recent Fair  Judgment: Poor  Insight: Shallow   Executive Functions  Concentration: Fair  Attention Span: Fair  Recall: [employer] of Knowledge: Fair  Language: Fair   Psychomotor Activity  Psychomotor Activity: Psychomotor Activity: Normal   Assets  Assets: Communication Skills; Physical Health; Resilience   Sleep  Sleep: Sleep: Good   Physical Exam: Physical Exam ROS Blood pressure 126/74, pulse 72, temperature (!) 97.5 F (36.4 C), temperature source Oral, resp. rate 20, height 5\' 7"  (1.702 m), weight 130.6 kg, SpO2 98 %. Body mass index is 45.11 kg/m.  Mental Status Per Nursing  Assessment::   On Admission:  Suicidal ideation indicated by patient, Thoughts of violence towards others, Suicidal ideation indicated by others, Intention to act on plan to harm others  Demographic Factors:  Male and NA  Loss Factors: NA  Historical Factors: Impulsivity  Risk Reduction Factors:   Positive coping skills or problem solving skills  Continued Clinical Symptoms:  Bipolar Disorder:   Mixed State Unstable or Poor Therapeutic Relationship  Cognitive Features That Contribute To Risk:  Thought constriction (tunnel vision)    Suicide Risk:  Mild:  Suicidal ideation of limited frequency, intensity, duration, and specificity.  There are no identifiable plans, no associated intent, mild dysphoria and related symptoms, good self-control (both objective and subjective assessment), few other risk factors, and identifiable protective factors, including available and accessible social support.   Follow-up Information     [facility] Follow up on 10/03/2022.   Why: You have upcoming appointments for therapy and medication management services on 10/03/22 at 3:00 pm, and 10/07/22 at 11:00 am.   The appointments will be Virtual telehealth. Contact information: [address]  [place] [address] [phone]                 Plan Of Care/Follow-up recommendations:  Activity:  as tolerated  Ranae Palms, MD 09/30/2022, 8:43 AM
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
## 2022-09-30 NOTE — Progress Notes (Signed)
  [facility] Adult Case Management Discharge Plan :  Will you be returning to the same living situation after discharge:  No. [facility]  At discharge, do you have transportation home?: Yes,  Taxi Do you have the ability to pay for your medications: Yes,  [employer] Medicare   Release of information consent forms completed and in the chart;  Patient's signature needed at discharge.  Patient to Follow up at:  Follow-up Information     [facility] Follow up on 10/03/2022.   Why: You have upcoming appointments for therapy and medication management services on 10/03/22 at 3:00 pm, and 10/07/22 at 11:00 am.   The appointments will be Virtual telehealth. Contact information: [place]  [place] [address] [phone]                 Next level of care provider has access to [place] and Suicide Prevention discussed: Yes,  with patient      Has patient been referred to the Quitline?: Patient refused referral  Patient has been referred for addiction treatment: N/A, patient states that he current does not use drugs or alcohol.   Darleen Crocker, LCSWA 09/30/2022, 9:53 AM

## 2022-09-30 NOTE — Progress Notes (Signed)
Discharge note: RN met with pt and reviewed pt's discharge instructions. Pt verbalized understanding of discharge instructions and pt did not have any questions. RN reviewed and provided pt with a copy of Suicide Safety Plan, SRA, AVS and Transition Record. RN returned pt's belongings to pt.  Pt denied SI/HI/AVH and voiced no concerns. Pt was appreciative of the care pt received at [facility]. Patient discharged to the lobby where cab driver was waiting to take pt to their destination.

## 2022-10-06 ENCOUNTER — Ambulatory Visit (HOSPITAL_COMMUNITY)
Admission: EM | Admit: 2022-10-06 | Discharge: 2022-10-07 | Disposition: A | Discharge status: Home / Self Care | Payer: Medicare HMO | Attending: Nurse Practitioner | Admitting: Nurse Practitioner

## 2022-10-06 DIAGNOSIS — R4183 Borderline intellectual functioning: Secondary | ICD-10-CM

## 2022-10-06 DIAGNOSIS — F909 Attention-deficit hyperactivity disorder, unspecified type: Secondary | ICD-10-CM | POA: Diagnosis present

## 2022-10-06 DIAGNOSIS — F845 Asperger's syndrome: Secondary | ICD-10-CM | POA: Diagnosis present

## 2022-10-06 DIAGNOSIS — Z1152 Encounter for screening for COVID-19: Secondary | ICD-10-CM | POA: Diagnosis not present

## 2022-10-06 DIAGNOSIS — U071 COVID-19: Secondary | ICD-10-CM | POA: Insufficient documentation

## 2022-10-06 DIAGNOSIS — F251 Schizoaffective disorder, depressive type: Secondary | ICD-10-CM | POA: Insufficient documentation

## 2022-10-06 DIAGNOSIS — Z5900 Homelessness unspecified: Secondary | ICD-10-CM

## 2022-10-06 DIAGNOSIS — F419 Anxiety disorder, unspecified: Secondary | ICD-10-CM | POA: Diagnosis not present

## 2022-10-06 DIAGNOSIS — F259 Schizoaffective disorder, unspecified: Secondary | ICD-10-CM | POA: Diagnosis present

## 2022-10-06 LAB — POCT URINE DRUG SCREEN - MANUAL ENTRY (I-SCREEN)
POC Amphetamine UR: NOT DETECTED
POC Buprenorphine (BUP): NOT DETECTED
POC Cocaine UR: NOT DETECTED
POC Marijuana UR: NOT DETECTED
POC Methadone UR: NOT DETECTED
POC Methamphetamine UR: NOT DETECTED
POC Morphine: NOT DETECTED
POC Oxazepam (BZO): NOT DETECTED
POC Oxycodone UR: NOT DETECTED
POC Secobarbital (BAR): NOT DETECTED

## 2022-10-06 MED ORDER — NICOTINE POLACRILEX 2 MG MT GUM: 2.0000 mg | CHEWING_GUM | OROMUCOSAL | Status: DC | PRN

## 2022-10-06 MED ORDER — MAGNESIUM HYDROXIDE 400 MG/5ML PO SUSP: 30.0000 mL | Freq: Every day | ORAL | Status: DC | PRN

## 2022-10-06 MED ORDER — TRAZODONE HCL 100 MG PO TABS
100.0000 mg | ORAL_TABLET | Freq: Every evening | ORAL | Status: DC | PRN
  Administered 2022-10-07: 100 mg via ORAL
  Filled 2022-10-06: qty 1

## 2022-10-06 MED ORDER — ALBUTEROL SULFATE HFA 108 (90 BASE) MCG/ACT IN AERS: 2.0000 | INHALATION_SPRAY | RESPIRATORY_TRACT | Status: DC | PRN

## 2022-10-06 MED ORDER — ESCITALOPRAM OXALATE 10 MG PO TABS
10.0000 mg | ORAL_TABLET | Freq: Every day | ORAL | Status: DC
  Administered 2022-10-07: 10 mg via ORAL
  Filled 2022-10-06: qty 1

## 2022-10-06 MED ORDER — HYDROXYZINE HCL 25 MG PO TABS
50.0000 mg | ORAL_TABLET | Freq: Three times a day (TID) | ORAL | Status: DC
  Administered 2022-10-07 (×3): 50 mg via ORAL
  Filled 2022-10-06 (×4): qty 2

## 2022-10-06 MED ORDER — OLANZAPINE 10 MG PO TABS
10.0000 mg | ORAL_TABLET | Freq: Every day | ORAL | Status: DC
  Administered 2022-10-07: 10 mg via ORAL
  Filled 2022-10-06 (×2): qty 1

## 2022-10-06 MED ORDER — ACETAMINOPHEN 325 MG PO TABS: 650.0000 mg | ORAL_TABLET | Freq: Four times a day (QID) | ORAL | Status: DC | PRN

## 2022-10-06 MED ORDER — METFORMIN HCL ER 500 MG PO TB24
500.0000 mg | ORAL_TABLET | Freq: Every day | ORAL | Status: DC
  Administered 2022-10-07: 500 mg via ORAL
  Filled 2022-10-06: qty 1

## 2022-10-06 MED ORDER — ALUM & MAG HYDROXIDE-SIMETH 200-200-20 MG/5ML PO SUSP: 30.0000 mL | ORAL | Status: DC | PRN

## 2022-10-06 MED ORDER — OLANZAPINE 5 MG PO TABS: 5.0000 mg | ORAL_TABLET | ORAL | Status: DC
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
## 2022-10-06 NOTE — ED Provider Notes (Incomplete)
[facility] Continuous Assessment Admission H&P  Date: 10/06/22 Patient Name: John Gibson MRN: [id number] Chief Complaint: ***  Diagnoses:  Final diagnoses:  None    HPI: ***  Total Time spent with patient: {Time; 15 min - 8 hours:17441}  Musculoskeletal  Strength & Muscle Tone: {desc; muscle tone:32375} Gait & Station: {PE GAIT ED PIRJ:18841} Patient leans: {Patient Leans:[id number]}  Psychiatric Specialty Exam  Presentation General Appearance:  Casual  Eye Contact: Fair  Speech: Clear and Coherent  Speech Volume: Normal  Handedness: Right   Mood and Affect  Mood: Anxious; Depressed  Affect: Congruent   Thought Process  Thought Processes: Coherent; Linear  Descriptions of Associations:Intact  Orientation:Full (Time, Place and Person)  Thought Content:Logical  Diagnosis of Schizophrenia or Schizoaffective disorder in past: Yes   Hallucinations:Hallucinations: None Description of Auditory Hallucinations: denies Description of Visual Hallucinations: denies  Ideas of Reference:None  Suicidal Thoughts:Suicidal Thoughts: No SI Passive Intent and/or Plan: Without Intent; Without Plan  Homicidal Thoughts:Homicidal Thoughts: Yes, Passive HI Passive Intent and/or Plan: Without Plan   Sensorium  Memory: Immediate Good; Recent Good; Remote Good  Judgment: Fair  Insight: Fair   Executive Functions  Concentration: Good  Attention Span: Good  Recall: Good  Fund of Knowledge: Good  Language: Good   Psychomotor Activity  Psychomotor Activity: Psychomotor Activity: Normal   Assets  Assets: Communication Skills; Physical Health; Desire for Improvement; Resilience   Sleep  Sleep: Sleep: Fair Number of Hours of Sleep: -1   Nutritional Assessment (For OBS and FBC admissions only) Has the patient had a weight loss or gain of 10 pounds or more in the last 3 months?: No Has the patient had a decrease in food intake/or  appetite?: No Does the patient have dental problems?: No Does the patient have eating habits or behaviors that may be indicators of an eating disorder including binging or inducing vomiting?: No Has the patient recently lost weight without trying?: 0 Has the patient been eating poorly because of a decreased appetite?: 1 Malnutrition Screening Tool Score: 1    Physical Exam Constitutional:      Appearance: Normal appearance.  HENT:     Head: Normocephalic and atraumatic.     Nose: Nose normal.  Eyes:     Pupils: Pupils are equal, round, and reactive to light.  Cardiovascular:     Rate and Rhythm: Normal rate.  Pulmonary:     Effort: Pulmonary effort is normal.  Abdominal:     General: Abdomen is flat.  Musculoskeletal:        General: Normal range of motion.     Cervical back: Normal range of motion.  Skin:    General: Skin is warm.  Neurological:     Mental Status: He is alert and oriented to person, place, and time.  Psychiatric:        Attention and Perception: Attention normal.        Mood and Affect: Mood is anxious.        Speech: Speech normal.        Thought Content: Thought content normal.        Cognition and Memory: Cognition normal.        Judgment: Judgment is impulsive.    ROS  Blood pressure (!) 123/98, pulse 92, temperature 98 F (36.7 C), temperature source Oral, resp. rate 18, SpO2 97 %. There is no height or weight on file to calculate BMI.  Past Psychiatric History: ***   Is the patient at risk  to self? {YES/NO:21197} Has the patient been a risk to self in the past 6 months? {YES/NO:21197}.    Has the patient been a risk to self within the distant past? {YES/NO:21197}  Is the patient a risk to others? {YES/NO:21197}  Has the patient been a risk to others in the past 6 months? {YES/NO:21197}  Has the patient been a risk to others within the distant past? {YES/NO:21197}  Past Medical History: ***  Family History: ***  Social History:  ***  Last Labs:  Admission on 10/06/2022  Component Date Value Ref Range Status  . POC Amphetamine UR 10/06/2022 None Detected  NONE DETECTED (Cut Off Level 1000 ng/mL) Final  . POC Secobarbital (BAR) 10/06/2022 None Detected  NONE DETECTED (Cut Off Level 300 ng/mL) Final  . POC Buprenorphine (BUP) 10/06/2022 None Detected  NONE DETECTED (Cut Off Level 10 ng/mL) Final  . POC Oxazepam (BZO) 10/06/2022 None Detected  NONE DETECTED (Cut Off Level 300 ng/mL) Final  . POC Cocaine UR 10/06/2022 None Detected  NONE DETECTED (Cut Off Level 300 ng/mL) Final  . POC Methamphetamine UR 10/06/2022 None Detected  NONE DETECTED (Cut Off Level 1000 ng/mL) Final  . POC Morphine 10/06/2022 None Detected  NONE DETECTED (Cut Off Level 300 ng/mL) Final  . POC Methadone UR 10/06/2022 None Detected  NONE DETECTED (Cut Off Level 300 ng/mL) Final  . POC Oxycodone UR 10/06/2022 None Detected  NONE DETECTED (Cut Off Level 100 ng/mL) Final  . POC Marijuana UR 10/06/2022 None Detected  NONE DETECTED (Cut Off Level 50 ng/mL) Final  Admission on 09/23/2022, Discharged on 09/24/2022  Component Date Value Ref Range Status  . SARS Coronavirus 2 by RT PCR 09/23/2022 NEGATIVE  NEGATIVE Final   Comment: (NOTE) SARS-CoV-2 target nucleic acids are NOT DETECTED.  The SARS-CoV-2 RNA is generally detectable in upper respiratory specimens during the acute phase of infection. The lowest concentration of SARS-CoV-2 viral copies this assay can detect is 138 copies/mL. A negative result does not preclude SARS-Cov-2 infection and should not be used as the sole basis for treatment or other patient management decisions. A negative result may occur with  improper specimen collection/handling, submission of specimen other than nasopharyngeal swab, presence of viral mutation(s) within the areas targeted by this assay, and inadequate number of viral copies(<138 copies/mL). A negative result must be combined with clinical observations,  patient history, and epidemiological information. The expected result is Negative.  Fact Sheet for Patients:  BloggerCourse.com  Fact Sheet for Healthcare Providers:  SeriousBroker.it  This test is no                          t yet approved or cleared by the [place] FDA and  has been authorized for detection and/or diagnosis of SARS-CoV-2 by FDA under an Emergency Use Authorization (EUA). This EUA will remain  in effect (meaning this test can be used) for the duration of the COVID-19 declaration under Section 564(b)(1) of the Act, 21 U.S.C.section 360bbb-3(b)(1), unless the authorization is terminated  or revoked sooner.      . Influenza A by PCR 09/23/2022 NEGATIVE  NEGATIVE Final  . Influenza B by PCR 09/23/2022 NEGATIVE  NEGATIVE Final   Comment: (NOTE) The Xpert Xpress SARS-CoV-2/FLU/RSV plus assay is intended as an aid in the diagnosis of influenza from Nasopharyngeal swab specimens and should not be used as a sole basis for treatment. Nasal washings and aspirates are unacceptable for Xpert Xpress SARS-CoV-2/FLU/RSV testing.  Fact Sheet for Patients: BloggerCourse.com  Fact Sheet for Healthcare Providers: SeriousBroker.it  This test is not yet approved or cleared by the [place] FDA and has been authorized for detection and/or diagnosis of SARS-CoV-2 by FDA under an Emergency Use Authorization (EUA). This EUA will remain in effect (meaning this test can be used) for the duration of the COVID-19 declaration under Section 564(b)(1) of the Act, 21 U.S.C. section 360bbb-3(b)(1), unless the authorization is terminated or revoked.    Marland Kitchen Resp Syncytial Virus by PCR 09/23/2022 NEGATIVE  NEGATIVE Final   Comment: (NOTE) Fact Sheet for Patients: BloggerCourse.com  Fact Sheet for Healthcare  Providers: SeriousBroker.it  This test is not yet approved or cleared by the [place] FDA and has been authorized for detection and/or diagnosis of SARS-CoV-2 by FDA under an Emergency Use Authorization (EUA). This EUA will remain in effect (meaning this test can be used) for the duration of the COVID-19 declaration under Section 564(b)(1) of the Act, 21 U.S.C. section 360bbb-3(b)(1), unless the authorization is terminated or revoked.  Performed at [facility], [address], [place], [place] [address]   . WBC 09/23/2022 12.0 (H)  4.0 - 10.5 K/uL Final  . RBC 09/23/2022 4.72  4.22 - 5.81 MIL/uL Final  . Hemoglobin 09/23/2022 14.8  13.0 - 17.0 g/dL Final  . HCT 85/46/2703 41.4  39.0 - 52.0 % Final  . MCV 09/23/2022 87.7  80.0 - 100.0 fL Final  . MCH 09/23/2022 31.4  26.0 - 34.0 pg Final  . MCHC 09/23/2022 35.7  30.0 - 36.0 g/dL Final  . RDW 50/05/3817 12.3  11.5 - 15.5 % Final  . Platelets 09/23/2022 322  150 - 400 K/uL Final  . nRBC 09/23/2022 0.0  0.0 - 0.2 % Final  . Neutrophils Relative % 09/23/2022 73  % Final  . Neutro Abs 09/23/2022 8.8 (H)  1.7 - 7.7 K/uL Final  . Lymphocytes Relative 09/23/2022 20  % Final  . Lymphs Abs 09/23/2022 2.4  0.7 - 4.0 K/uL Final  . Monocytes Relative 09/23/2022 4  % Final  . Monocytes Absolute 09/23/2022 0.5  0.1 - 1.0 K/uL Final  . Eosinophils Relative 09/23/2022 1  % Final  . Eosinophils Absolute 09/23/2022 0.1  0.0 - 0.5 K/uL Final  . Basophils Relative 09/23/2022 1  % Final  . Basophils Absolute 09/23/2022 0.2 (H)  0.0 - 0.1 K/uL Final  . Immature Granulocytes 09/23/2022 1  % Final  . Abs Immature Granulocytes 09/23/2022 0.06  0.00 - 0.07 K/uL Final   Performed at [facility], [address], [place], [place] [address]  . Sodium 09/23/2022 138  135 - 145 mmol/L Final  . Potassium 09/23/2022 4.1  3.5 - 5.1 mmol/L Final  . Chloride 09/23/2022 105  98 - 111 mmol/L Final  . CO2 09/23/2022 23  22 -  32 mmol/L Final  . Glucose, Bld 09/23/2022 100 (H)  70 - 99 mg/dL Final   Glucose reference range applies only to samples taken after fasting for at least 8 hours.  . BUN 09/23/2022 8  6 - 20 mg/dL Final  . Creatinine, Ser 09/23/2022 0.79  0.61 - 1.24 mg/dL Final  . Calcium 16/96/7893 9.3  8.9 - 10.3 mg/dL Final  . Total Protein 09/23/2022 7.3  6.5 - 8.1 g/dL Final  . Albumin 81/09/7508 3.9  3.5 - 5.0 g/dL Final  . AST 25/85/2778 51 (H)  15 - 41 U/L Final  . ALT 09/23/2022 99 (H)  0 - 44 U/L Final  . Alkaline Phosphatase 09/23/2022 79  38 - 126 U/L Final  . Total Bilirubin 09/23/2022 0.5  0.3 - 1.2 mg/dL Final  . GFR, Estimated 09/23/2022 >60  >60 mL/min Final   Comment: (NOTE) Calculated using the CKD-EPI Creatinine Equation (2021)   . Anion gap 09/23/2022 10  5 - 15 Final   Performed at [facility], [place] [address], [place], [place] [address]  . Alcohol, Ethyl (B) 09/23/2022 <10  <10 mg/dL Final   Comment: (NOTE) Lowest detectable limit for serum alcohol is 10 mg/dL.  For medical purposes only. Performed at [facility], [place] [address], [place], [place] [address]   . Cholesterol 09/23/2022 242 (H)  0 - 200 mg/dL Final  . Triglycerides 09/23/2022 194 (H)  <150 mg/dL Final  . HDL 09/23/2022 31 (L)  >40 mg/dL Final  . Total CHOL/HDL Ratio 09/23/2022 7.8  RATIO Final  . VLDL 09/23/2022 39  0 - 40 mg/dL Final  . LDL Cholesterol 09/23/2022 172 (H)  0 - 99 mg/dL Final   Comment:        Total Cholesterol/HDL:CHD Risk Coronary Heart Disease Risk Table                     Men   Women  1/2 Average Risk   3.4   3.3  Average Risk       5.0   4.4  2 X Average Risk   9.6   7.1  3 X Average Risk  23.4   11.0        Use the calculated Patient Ratio above and the CHD Risk Table to determine the patient's CHD Risk.        ATP III CLASSIFICATION (LDL):  <100     mg/dL   Optimal  100-129  mg/dL   Near or Above                    Optimal  130-159  mg/dL   Borderline  160-189   mg/dL   High  >190     mg/dL   Very High Performed at [facility] [address] [place], [place] [address]   . POC Amphetamine UR 09/23/2022 None Detected  NONE DETECTED (Cut Off Level 1000 ng/mL) Final  . POC Secobarbital (BAR) 09/23/2022 None Detected  NONE DETECTED (Cut Off Level 300 ng/mL) Final  . POC Buprenorphine (BUP) 09/23/2022 None Detected  NONE DETECTED (Cut Off Level 10 ng/mL) Final  . POC Oxazepam (BZO) 09/23/2022 None Detected  NONE DETECTED (Cut Off Level 300 ng/mL) Final  . POC Cocaine UR 09/23/2022 None Detected  NONE DETECTED (Cut Off Level 300 ng/mL) Final  . POC Methamphetamine UR 09/23/2022 None Detected  NONE DETECTED (Cut Off Level 1000 ng/mL) Final  . POC Morphine 09/23/2022 None Detected  NONE DETECTED (Cut Off Level 300 ng/mL) Final  . POC Methadone UR 09/23/2022 None Detected  NONE DETECTED (Cut Off Level 300 ng/mL) Final  . POC Oxycodone UR 09/23/2022 None Detected  NONE DETECTED (Cut Off Level 100 ng/mL) Final  . POC Marijuana UR 09/23/2022 None Detected  NONE DETECTED (Cut Off Level 50 ng/mL) Final  . SARSCOV2ONAVIRUS 2 AG 09/23/2022 NEGATIVE  NEGATIVE Final   Comment: (NOTE) SARS-CoV-2 antigen NOT DETECTED.   Negative results are presumptive.  Negative results do not preclude SARS-CoV-2 infection and should not be used as the sole basis for treatment or other  patient management decisions, including infection  control decisions, particularly in the presence of clinical signs and  symptoms consistent with COVID-19, or in those who have been in contact with the virus.  Negative results must be combined with clinical observations, patient history, and epidemiological information. The expected result is Negative.  Fact Sheet for Patients: https://www.jennings-kim.com/  Fact Sheet for Healthcare Providers: https://alexander-rogers.biz/  This test is not yet approved or cleared by the [place] FDA and  has been  authorized for detection and/or diagnosis of SARS-CoV-2 by FDA under an Emergency Use Authorization (EUA).  This EUA will remain in effect (meaning this test can be used) for the duration of  the COV                          ID-19 declaration under Section 564(b)(1) of the Act, 21 U.S.C. section 360bbb-3(b)(1), unless the authorization is terminated or revoked sooner.      Allergies: Buspirone, Tape, Divalproex sodium, and Nickel  Medications:  Facility Ordered Medications  Medication  . traZODone (DESYREL) tablet 100 mg  . [START ON 10/07/2022] metFORMIN (GLUCOPHAGE-XR) 24 hr tablet 500 mg  . hydrOXYzine (ATARAX) tablet 50 mg  . [START ON 10/07/2022] escitalopram (LEXAPRO) tablet 10 mg  . albuterol (VENTOLIN HFA) 108 (90 Base) MCG/ACT inhaler 2 puff  . nicotine polacrilex (NICORETTE) gum 2 mg  . OLANZapine (ZYPREXA) tablet 10 mg  . OLANZapine (ZYPREXA) tablet 5 mg  . acetaminophen (TYLENOL) tablet 650 mg  . alum & mag hydroxide-simeth (MAALOX/MYLANTA) 200-200-20 MG/5ML suspension 30 mL  . magnesium hydroxide (MILK OF MAGNESIA) suspension 30 mL   PTA Medications  Medication Sig  . albuterol (VENTOLIN HFA) 108 (90 Base) MCG/ACT inhaler Inhale 2 puffs into the lungs every 4 (four) hours as needed for wheezing or shortness of breath.  . OLANZapine (ZYPREXA) 5 MG tablet Take 1 tablet (5 mg total) by mouth as directed. Take 1 tablet at 8am. take 1 tablet at 2pm. take 2 tablets at bedtime. (Patient taking differently: Take 5 mg by mouth daily.)  . hydrOXYzine (ATARAX) 50 MG tablet Take 50 mg by mouth 3 (three) times daily.  Marland Kitchen OLANZapine (ZYPREXA) 10 MG tablet Take 10 mg by mouth at bedtime.  Marland Kitchen escitalopram (LEXAPRO) 10 MG tablet Take 1 tablet (10 mg total) by mouth daily.  . nicotine polacrilex (NICORETTE) 2 MG gum Take 1 each (2 mg total) by mouth as needed for smoking cessation.  . traZODone (DESYREL) 100 MG tablet Take 1 tablet (100 mg total) by mouth at bedtime as needed for sleep.   . metFORMIN (GLUCOPHAGE-XR) 500 MG 24 hr tablet Take 1 tablet (500 mg total) by mouth daily with breakfast.    Medical Decision Making  ***    Recommendations  [facility] MSE Recommendations:[id number]}  Jasper Riling, NP 10/06/22  11:57 PM

## 2022-10-06 NOTE — ED Provider Notes (Signed)
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 total) by mouth daily.   nicotine polacrilex (NICORETTE) 2 MG gum Take 1 each (2 mg total) by mouth as needed for smoking cessation.   traZODone (DESYREL) 100 MG tablet Take 1 tablet (100 mg total) by mouth at bedtime as needed for sleep.   metFORMIN (GLUCOPHAGE-XR) 500 MG 24 hr tablet Take 1 tablet (500 mg total) by mouth daily with breakfast.    Medical Decision Making  Niels Cranshaw is a 32 y/o male presenting voluntarily to [facility] worsening depression symptoms since being discharged from [facility] on 09/30/2022. Patient reports that he does not feel safe being at [facility] and he is currently homeless.     Recommendations  Based on my evaluation  the patient does not appear to have an emergency medical condition. Patient will be admitted to [facility] continuous observation for crisis management, stabilization and safety.   Jasper Riling, NP 10/07/22  2:08 AM
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
## 2022-10-07 ENCOUNTER — Encounter (HOSPITAL_COMMUNITY): Payer: Self-pay | Admitting: Registered Nurse

## 2022-10-07 DIAGNOSIS — Z5900 Homelessness unspecified: Secondary | ICD-10-CM | POA: Diagnosis not present

## 2022-10-07 DIAGNOSIS — R4183 Borderline intellectual functioning: Secondary | ICD-10-CM | POA: Diagnosis not present

## 2022-10-07 DIAGNOSIS — F251 Schizoaffective disorder, depressive type: Secondary | ICD-10-CM | POA: Diagnosis not present

## 2022-10-07 DIAGNOSIS — F845 Asperger's syndrome: Secondary | ICD-10-CM | POA: Diagnosis not present

## 2022-10-07 LAB — CBC WITH DIFFERENTIAL/PLATELET
Abs Immature Granulocytes: 0.02 10*3/uL (ref 0.00–0.07)
Basophils Absolute: 0.1 10*3/uL (ref 0.0–0.1)
Basophils Relative: 1 %
Eosinophils Absolute: 0.4 10*3/uL (ref 0.0–0.5)
Eosinophils Relative: 6 %
HCT: 43.6 % (ref 39.0–52.0)
Hemoglobin: 14.5 g/dL (ref 13.0–17.0)
Immature Granulocytes: 0 %
Lymphocytes Relative: 39 %
Lymphs Abs: 2.8 10*3/uL (ref 0.7–4.0)
MCH: 30.3 pg (ref 26.0–34.0)
MCHC: 33.3 g/dL (ref 30.0–36.0)
MCV: 91.2 fL (ref 80.0–100.0)
Monocytes Absolute: 0.6 10*3/uL (ref 0.1–1.0)
Monocytes Relative: 8 %
Neutro Abs: 3.3 10*3/uL (ref 1.7–7.7)
Neutrophils Relative %: 46 %
Platelets: 303 10*3/uL (ref 150–400)
RBC: 4.78 MIL/uL (ref 4.22–5.81)
RDW: 12.4 % (ref 11.5–15.5)
WBC: 7.1 10*3/uL (ref 4.0–10.5)
nRBC: 0 % (ref 0.0–0.2)

## 2022-10-07 LAB — COMPREHENSIVE METABOLIC PANEL
ALT: 65 U/L — ABNORMAL HIGH (ref 0–44)
AST: 39 U/L (ref 15–41)
Albumin: 3.7 g/dL (ref 3.5–5.0)
Alkaline Phosphatase: 68 U/L (ref 38–126)
Anion gap: 8 (ref 5–15)
BUN: 10 mg/dL (ref 6–20)
CO2: 28 mmol/L (ref 22–32)
Calcium: 8.9 mg/dL (ref 8.9–10.3)
Chloride: 101 mmol/L (ref 98–111)
Creatinine, Ser: 0.9 mg/dL (ref 0.61–1.24)
GFR, Estimated: >60 mL/min (ref >60)
Glucose, Bld: 94 mg/dL (ref 70–99)
Potassium: 4.3 mmol/L (ref 3.5–5.1)
Sodium: 137 mmol/L (ref 135–145)
Total Bilirubin: 0.5 mg/dL (ref 0.3–1.2)
Total Protein: 7 g/dL (ref 6.5–8.1)

## 2022-10-07 LAB — POC SARS CORONAVIRUS 2 AG: SARSCOV2ONAVIRUS 2 AG: NEGATIVE

## 2022-10-07 LAB — RESP PANEL BY RT-PCR (RSV, FLU A&B, COVID)  RVPGX2
Influenza A by PCR: NEGATIVE
Influenza B by PCR: NEGATIVE
Resp Syncytial Virus by PCR: NEGATIVE
SARS Coronavirus 2 by RT PCR: POSITIVE — AB

## 2022-10-07 LAB — ETHANOL: Alcohol, Ethyl (B): <10 mg/dL (ref <10)

## 2022-10-07 MED ORDER — GUAIFENESIN ER 600 MG PO TB12
600.0000 mg | ORAL_TABLET | Freq: Two times a day (BID) | ORAL | Status: DC | PRN
  Administered 2022-10-07: 600 mg via ORAL
  Filled 2022-10-07: qty 1

## 2022-10-07 MED ORDER — OLANZAPINE 5 MG PO TABS
5.0000 mg | ORAL_TABLET | Freq: Two times a day (BID) | ORAL | Status: DC
  Administered 2022-10-07: 5 mg via ORAL
  Filled 2022-10-07: qty 1
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
## 2022-10-07 NOTE — Discharge Instructions (Addendum)
[facility] Address: [place], [place], [place] [address] Phone: [phone]  Supported Employment The supported employment program is a person-centered, individualized, evidence-based support service that helps members choose, acquire, and maintain competitive employment in our community. This service supports the varying needs of individuals and promotes community inclusion and employment success. Members enrolled in the supported employment program can expect the following:  Development of an individual career plan Community based job placement Job shadowing Job development On-site job Teacher, early years/pre and support  Supported Education Supported education helps our members receive the education and training they need to achieve their learning and recovery goals. This will assist members with becoming gainfully employed in the job or career of their choice. The program includes assistance with: Registering for disability accommodations Enrolling in school and registering for classes Learning communication skills Scheduling tutoring sessions within your school [facility] partners with Vocational Rehabilitation to help increase the success of clients seeking employment and educational goals.  Want to learn more about our programs?   Please contact our intake department INTAKE: [phone] Ext [phone]  Mailing: [place]   Portal, [place] [address]   [email or web address]  Hours Monday - Friday: Services: 8:00AM - 3:00PM Offices: 8:00AM - 5:00PM  Physical Address [place], [place] [address]   Please use this address for [facility] Mailing Address [address] [place], [place] [address]  The [facility] helps people reconnect This is a safe place to rest, take care of basic needs and access the services and community that make all the difference. Our guests come to the [facility] to take a class, do laundry, meet with a case  manager or to get their mail. Sometimes they just need to sit in our dayroom and enjoy a conversation.  Here you will find everything from shower facilities to a computer lab, a mail room, classrooms and meeting spaces.  The [facility] helps people reconnect with their own lives and with the community at large.  A caring community setting One of the most exciting aspects of the IRC is that so many individuals and organizations in the community are a part of the everyday experience. Whether it's a hair stylist or law firm offering services right in-house, our partners make the [facility] a truly interactive resource center where services are brought to our guests. The IRC brings together a comprehensive community of talented people who not only want to help solve problems, but also to be a part of our guests' lives.  Integrated Care We take a person-centered approach to assistance that includes: Case management Lane clinic Mental health nurse Referrals  Fundamental Services We start with necessities: Engineer, maintenance (IT) and Risk manager addresses and mailboxes Replacement IDs Onsite barbershop Storage lockers [place] winter warming center  Self-Sufficiency We connect our guests with: Skilled trade classes Job skills classes Resume and jobs application assistance Interview training GED Advertising copywriter            Substance Abuse Treatment Programs  Intensive Outpatient Programs [place]     [place]. [place], [place]       [facility] [place] [address] [place], [place]     (Inpatient  and outpatient)     [address]           [phone]    [facility] [phone] (Suboxone and Methadone)  [address]      [place],  [place] [address]      [phone]       [address] [place], [place] [phone]  [facility] (Outpatient/Inpatient, Chemical)    (insurance only) [phone]             [facility] (Groups & Residential) [place], [place] [phone]     [facility]     [address]     [place], [place]      [phone]       [facility] (for caregivers and family) [phone] [address] [place], [place] [phone]      Residential Treatment Programs [facility]      [address], [place], [place] [address]  [phone]       [facility] [address] [place], [place] [address] [phone]  [facility]        [phone]       [facility] [phone]  [facility] ([facility]             [address]                                         [place], [place]                                                [phone] or [phone]                               [facility] [place] [address] [place], [id number] [phone]  [facility]    [address]      [place], [place]     [phone]       [facility] [address] [place], [place] [phone]  [facility]   [address]     [place], [place] [address]     [phone]      Admissions: 8am-3pm M-F  [facility] (RTS) [address] [place], [place] [phone]  [facility]ATS Program: Residential Program [phone](90 Days)   [place], [place]      [phone] or [phone]     ADATC: [facility] [place], [place] (Walk in Hours over the weekend or by referral)  [facility] [address] [place], [place], [place] [address] [phone]  Crisis Mobile: [facility]rapeutic Alternatives:  [phone] (for crisis response 24 hours a day) [facility] Hotline:      [phone] Outpatient Psychiatry and Counseling  Therapeutic  Alternatives: Mobile Crisis Management 24 hours:  [phone]  [facility] sliding scale fee and walk in schedule: M-F 8am-12pm/1pm-3pm [address]  [place], [place] [address] [phone]  [facility] [address] [place], [place] [address] [phone]  [facility] ([facility])- new patient walk-in appointments available Monday - Friday 8am -3pm.          [address] [place], [place] [address]  [phone] or crisis line- [facility]/ Intensive Outpatient Therapy Program [place], [place] [address] [place]      [phone] [address] [place], [place] [address]                 [place]   [facility] [phone]. [place], [place] [address]   [employer] of Care          [address] Johnette Abraham  [place], [place] [address]       [phone]  [place], [place], [place] [address] [phone]  [facility]    [address] [place], [place] [address]     [place], [place] Joycelyn Man     [place] [address]     [phone]       [facility] [place] [address]  [facility]     [address] [place], [place], [place], [place] [address] [place]     [address]     [place], [place] [address]     [phone]       [facility] [address] [place], [place] [address] [phone] Resources for Temporary Residential Assistance/Crisis [facility]) M-F 8am-3pm   [address] [place], [place] [address]   [phone] Services include: laundry, barbering, support groups, case management, phone  & computer access, showers, AA/NA mtgs, mental health/substance abuse nurse, job skills class, disability information, VA assistance, spiritual classes, etc.   [facility]   [address], [place]     [place] (women and children)       [place]. [place], [place] [address] [phone] [email or web address] for application and process Application Required  [facility]   [address]    [place] [address]     [phone]                    [place], [place] [address] [phone](IZTIWPYK application appt.) Application Required  [facility] (women only)    [address]     [place], [place] [address]     [phone]      Intake starts 6pm daily Need valid ID, SSC, & Police report [employer] [address] [place],  [phone] Application Required  [employer] (men only)     [place].      [place], [place]       [place] (Pregnant women only) [address] [place], [place]  [facility]      [address]      [place], [place] [address]     [phone]             [facility] [address] [place], [place] [phone] 90 day commitment/SA/Application process  [facility](men only)     [address]     [place], [place]     [phone]       Check-in at [facility] [address] [place], [place] [phone] Men/Women/Women and Children must be there by 7 pm  [facility] [place], [place] [phone]
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
## 2022-10-07 NOTE — Progress Notes (Signed)
John Gibson continues to rest quietly in his chair bed.
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
